# Patient Record
Sex: Female | Born: 1950 | Race: White | Hispanic: No | Marital: Single | Smoking: Current every day smoker
Health system: Southern US, Community
[De-identification: ages and names within clinical notes are randomized; demographics above are authoritative.]

## PROBLEM LIST (undated history)

## (undated) DIAGNOSIS — I10 Essential (primary) hypertension: Secondary | ICD-10-CM

## (undated) DIAGNOSIS — K529 Noninfective gastroenteritis and colitis, unspecified: Secondary | ICD-10-CM

## (undated) DIAGNOSIS — M797 Fibromyalgia: Secondary | ICD-10-CM

## (undated) DIAGNOSIS — S060XAA Concussion with loss of consciousness status unknown, initial encounter: Secondary | ICD-10-CM

## (undated) DIAGNOSIS — G4730 Sleep apnea, unspecified: Secondary | ICD-10-CM

## (undated) DIAGNOSIS — K219 Gastro-esophageal reflux disease without esophagitis: Secondary | ICD-10-CM

## (undated) DIAGNOSIS — F329 Major depressive disorder, single episode, unspecified: Secondary | ICD-10-CM

## (undated) DIAGNOSIS — R0681 Apnea, not elsewhere classified: Secondary | ICD-10-CM

## (undated) DIAGNOSIS — S060X9A Concussion with loss of consciousness of unspecified duration, initial encounter: Secondary | ICD-10-CM

## (undated) DIAGNOSIS — R011 Cardiac murmur, unspecified: Secondary | ICD-10-CM

## (undated) DIAGNOSIS — J189 Pneumonia, unspecified organism: Secondary | ICD-10-CM

## (undated) DIAGNOSIS — F419 Anxiety disorder, unspecified: Secondary | ICD-10-CM

## (undated) DIAGNOSIS — G629 Polyneuropathy, unspecified: Secondary | ICD-10-CM

## (undated) DIAGNOSIS — F32A Depression, unspecified: Secondary | ICD-10-CM

## (undated) DIAGNOSIS — R51 Headache: Secondary | ICD-10-CM

## (undated) DIAGNOSIS — R519 Headache, unspecified: Secondary | ICD-10-CM

## (undated) DIAGNOSIS — N3010 Interstitial cystitis (chronic) without hematuria: Secondary | ICD-10-CM

## (undated) DIAGNOSIS — M069 Rheumatoid arthritis, unspecified: Secondary | ICD-10-CM

## (undated) DIAGNOSIS — H269 Unspecified cataract: Secondary | ICD-10-CM

## (undated) DIAGNOSIS — R42 Dizziness and giddiness: Secondary | ICD-10-CM

## (undated) DIAGNOSIS — E7800 Pure hypercholesterolemia, unspecified: Secondary | ICD-10-CM

## (undated) HISTORY — DX: Noninfective gastroenteritis and colitis, unspecified: K52.9

## (undated) HISTORY — DX: Major depressive disorder, single episode, unspecified: F32.9

## (undated) HISTORY — DX: Interstitial cystitis (chronic) without hematuria: N30.10

## (undated) HISTORY — DX: Anxiety disorder, unspecified: F41.9

## (undated) HISTORY — PX: TONSILLECTOMY: SUR1361

## (undated) HISTORY — DX: Pure hypercholesterolemia, unspecified: E78.00

## (undated) HISTORY — PX: COLONOSCOPY: SHX174

## (undated) HISTORY — DX: Essential (primary) hypertension: I10

## (undated) HISTORY — PX: CARPAL TUNNEL RELEASE: SHX101

## (undated) HISTORY — DX: Concussion with loss of consciousness status unknown, initial encounter: S06.0XAA

## (undated) HISTORY — DX: Dizziness and giddiness: R42

## (undated) HISTORY — DX: Rheumatoid arthritis, unspecified: M06.9

## (undated) HISTORY — DX: Apnea, not elsewhere classified: R06.81

## (undated) HISTORY — DX: Unspecified cataract: H26.9

## (undated) HISTORY — DX: Depression, unspecified: F32.A

## (undated) HISTORY — PX: ROTATOR CUFF REPAIR: SHX139

## (undated) HISTORY — DX: Concussion with loss of consciousness of unspecified duration, initial encounter: S06.0X9A

## (undated) HISTORY — DX: Polyneuropathy, unspecified: G62.9

## (undated) HISTORY — DX: Fibromyalgia: M79.7

---

## 2007-04-10 ENCOUNTER — Encounter: Admission: RE | Admit: 2007-04-10 | Discharge: 2007-04-10 | Payer: Self-pay | Admitting: Internal Medicine

## 2007-09-03 ENCOUNTER — Ambulatory Visit: Payer: Self-pay | Admitting: Cardiology

## 2015-01-05 ENCOUNTER — Other Ambulatory Visit: Payer: Self-pay | Admitting: Family Medicine

## 2015-01-05 DIAGNOSIS — F0781 Postconcussional syndrome: Secondary | ICD-10-CM

## 2015-01-05 DIAGNOSIS — R410 Disorientation, unspecified: Secondary | ICD-10-CM

## 2015-01-05 DIAGNOSIS — R413 Other amnesia: Secondary | ICD-10-CM

## 2015-02-01 ENCOUNTER — Other Ambulatory Visit: Payer: Self-pay

## 2015-02-08 ENCOUNTER — Other Ambulatory Visit: Payer: Self-pay

## 2016-04-16 ENCOUNTER — Ambulatory Visit (INDEPENDENT_AMBULATORY_CARE_PROVIDER_SITE_OTHER): Payer: Medicare HMO | Admitting: Neurology

## 2016-04-16 ENCOUNTER — Encounter: Payer: Self-pay | Admitting: Neurology

## 2016-04-16 VITALS — Ht 68.0 in | Wt 142.4 lb

## 2016-04-16 DIAGNOSIS — R42 Dizziness and giddiness: Secondary | ICD-10-CM

## 2016-04-16 DIAGNOSIS — R413 Other amnesia: Secondary | ICD-10-CM | POA: Diagnosis not present

## 2016-04-16 DIAGNOSIS — E538 Deficiency of other specified B group vitamins: Secondary | ICD-10-CM | POA: Diagnosis not present

## 2016-04-16 DIAGNOSIS — R51 Headache: Secondary | ICD-10-CM

## 2016-04-16 DIAGNOSIS — R410 Disorientation, unspecified: Secondary | ICD-10-CM | POA: Diagnosis not present

## 2016-04-16 DIAGNOSIS — R519 Headache, unspecified: Secondary | ICD-10-CM

## 2016-04-16 NOTE — Patient Instructions (Addendum)
Remember to drink plenty of fluid, eat healthy meals and do not skip any meals. Try to eat protein with a every meal and eat a healthy snack such as fruit or nuts in between meals. Try to keep a regular sleep-wake schedule and try to exercise daily, particularly in the form of walking, 20-30 minutes a day, if you can.   As far as your medications are concerned, I would like to suggest: Try meclizine for the vertigo  As far as diagnostic testing: MRI of the brain  I would like to see you back in 3 months, sooner if we need to. Please call us with any interim questions, concerns, problems, updates or refill requests.   Our phone number is [phone]. We also have an after hours call service for urgent matters and there is a physician on-call for urgent questions. For any emergencies you know to call 911 or go to the nearest emergency room

## 2016-04-16 NOTE — Progress Notes (Signed)
[facility]    Provider:  Dr Lucia Gaskins Referring Provider: Stoney Bang, FNP Primary Care Physician:  Stoney Bang, FNP  CC:  Vertigo, memory loss and confusion, headache  HPI:  Joy Thomas is a 66 y.o. female here as a referral from Dr. Craig Guess for vertigo. Medical history hyperlipidemia, depression, hypertension, chronic obstructive lung disease, GERD, shoulder surgery and carpal tunnel surgery on the right, lymphocytic colitis. She is a current every day smoker of half pack per day. No alcohol. Her father has a history of Alzheimer's and parkinson's disease and she is worried it may be hereditary. She had a concussion several years ago and had a concussion and headache and she still is on lamotrigine since 2016 for headache which never improved and recently worsened. A month ago she was trying to clean up and she hit the back of her head and she had all the symptoms of when she had a concussion. She first noticed memory issues a year ago but difficult to tell. She has pain in her neck and in the back of her head where she hit on the corner of the cabinet. For several months she has felt more confused, she forgot to put a straw in her drink the other day and she has been putting a straw in there for years. She has a lot of anxiety and in the 80s she became house bound with panic attacks. She has a lot of anxiety. Panic attacks are coming back. She lives independently. All the bills are paid. No accidents in the home. She takes her medications. She does not repeat the same stories. No hallucinations or delusions. She does not get lost driving. She will go five miles out of the way to miss lights. No problems with day, month, year. No other focal neurologic deficits, associated symptoms, inciting events or modifiable factors.  MRI cervical spine 2012: reviewed report  FINDINGS: .Craniocervical junction: Normal. .Cervical spinal cord: Normal signal and  contour. .Vertebrae:No marrow signal abnormalities to suggest neoplasm. Reversal of usual mid cervical lordosis. .Paraspinal soft tissues: No edema or masses. .Discs/Levels: .C2-3: Unremarkable. .C3-4: Unremarkable. .C4-5: Small posterior disc osteophyte complex with mild central canal stenosis. .C5-6: Small broad-based posterior disc osteophyte with mild central canal stenosis .C6-7: Small broad-based posterior disc osteophyte complex without significant central canal stenosis. Small amount of left-sided uncovertebral spurring results in mild foraminal stenosis. .C7-T1: Unremarkable.  Review of Systems: Patient complains of symptoms per HPI as well as the following symptoms: no CP, no SOB. Pertinent negatives per HPI. All others negative.   Social History   Social History  . Marital status: Single    Spouse name: N/A  . Number of children: 0  . Years of education: Master's   Occupational History  . Retired    Social History Main Topics  . Smoking status: Current Every Day Smoker    Packs/day: 0.75    Types: Cigarettes  . Smokeless tobacco: Never Used  . Alcohol use No  . Drug use: No  . Sexual activity: Not on file   Other Topics Concern  . Not on file   Social History Narrative   Lives at home w/ her 2 cats   Right-handed   Caffeine: 12-24 oz per day    Family History  Problem Relation Age of Onset  . Other Mother     Blunt force trauma  . Stroke Mother   . Heart attack Mother   . Heart failure Father   . Parkinson's disease  Father   . Alzheimer's disease Father     Past Medical History:  Diagnosis Date  . Anxiety   . Apnea    C pap  . Cataract   . Chronic interstitial cystitis   . Colitis   . Concussion   . Depression   . Fibromyalgia   . High cholesterol   . Hypertension   . Neuropathy (HCC)   . Rheumatoid arthritis (HCC)   . Vertigo     Past Surgical History:  Procedure Laterality Date  . CARPAL  TUNNEL RELEASE    . ROTATOR CUFF REPAIR    . TONSILLECTOMY      Current Outpatient Prescriptions  Medication Sig Dispense Refill  . aspirin (ECOTRIN LOW STRENGTH) 81 MG EC tablet Take 81 mg by mouth daily. Swallow whole.    . budesonide (ENTOCORT EC) 3 MG 24 hr capsule Take by mouth.    . busPIRone (BUSPAR) 5 MG tablet Take 15 mg by mouth 2 (two) times daily.    . Calcium Carb-Cholecalciferol (CALCIUM + D3) 600-200 MG-UNIT TABS Take 1 tablet by mouth 2 (two) times daily.    . Cholecalciferol (VITAMIN D3) 2000 units capsule Take 1 capsule by mouth daily.    . clorazepate (TRANXENE) 7.5 MG tablet Take by mouth.    . co-enzyme Q-10 30 MG capsule Take by mouth.    . Cyanocobalamin (VITAMIN B12 PO) Take by mouth daily.    [name] docusate sodium (COLACE) 100 MG capsule Take by mouth.    . esomeprazole (NEXIUM) 40 MG capsule Take by mouth.    . Flaxseed, Linseed, (FLAX SEED OIL PO) Take by mouth daily.    [name] gabapentin (NEURONTIN) 300 MG capsule Take 300 mg by mouth 3 (three) times daily.    . hyoscyamine (LEVSIN SL) 0.125 MG SL tablet Place under the tongue as needed.    . lamoTRIgine (LAMICTAL) 100 MG tablet Take by mouth.    . loratadine (CLARITIN) 10 MG tablet Take by mouth.    . Multiple Vitamins-Minerals (MULTIVITAMIN ADULT PO) Take 1 tablet by mouth daily.    [name] olmesartan-hydrochlorothiazide (BENICAR HCT) 20-12.5 MG tablet Take 1 tablet by mouth daily.    . Omega-3 Fatty Acids (FISH OIL) 1200 MG CAPS Take by mouth daily.    [name] oxybutynin (DITROPAN) 5 MG tablet Take by mouth.    . potassium chloride SA (K-DUR,KLOR-CON) 20 MEQ tablet 1 by mouth daily    . Probiotic Product (PROBIOTIC PO) Take by mouth daily.    . simvastatin (ZOCOR) 40 MG tablet 1 tab po qd    . vitamin E 400 UNIT capsule Take 800 Units by mouth daily.     No current facility-administered medications for this visit.     Allergies as of 04/16/2016 - Review Complete 04/16/2016  Allergen Reaction Noted  . Amoxicillin-pot  clavulanate Nausea And Vomiting 01/16/2011  . Elmiron  [pentosan polysulfate sodium]  11/06/2011  . Erythromycin  01/16/2011  . Hydrocodone-acetaminophen Rash and Swelling 01/16/2011  . Naproxen  04/07/2015  . Clindamycin  01/17/2011  . Pseudoephedrine Anxiety 09/26/2014    Vitals: Ht 5\' 8"  (1.727 m)   Wt 142 lb 6.4 oz (64.6 kg)   BMI 21.65 kg/m  Last Weight:  Wt Readings from Last 1 Encounters:  04/16/16 142 lb 6.4 oz (64.6 kg)   Last Height:   Ht Readings from Last 1 Encounters:  04/16/16 5\' 8"  (1.727 m)   Physical exam: Exam: Gen: NAD, conversant, well nourised, Thin, well groomed,  Anxious and tangential                   CV: RRR, no MRG. No Carotid Bruits. No peripheral edema, warm, nontender Eyes: Conjunctivae clear without exudates or hemorrhage  Neuro: Detailed Neurologic Exam  Speech:    Speech is normal; fluent and spontaneous with normal comprehension.  Cognition:  MMSE - Mini Mental State Exam 04/16/2016  Orientation to time 5  Orientation to Place 5  Registration 3  Attention/ Calculation 5  Recall 3  Language- name 2 objects 2  Language- repeat 1  Language- follow 3 step command 3  Language- read & follow direction 1  Write a sentence 0  Copy design 1  Total score 29      The patient is oriented to person, place, and time;     recent and remote memory intact;     language fluent;     normal attention, concentration,     fund of knowledge Cranial Nerves:    The pupils are equal, round, and reactive to light. The fundi are normal and spontaneous venous pulsations are present. Visual fields are full to finger confrontation. Extraocular movements are intact. Trigeminal sensation is intact and the muscles of mastication are normal. The face is symmetric. The palate elevates in the midline. Hearing intact. Voice is normal. Shoulder shrug is normal. The tongue has normal motion without fasciculations.   Coordination:    Normal finger to nose and heel to  shin. Normal rapid alternating movements.   Gait:    Heel-toe and tandem gait are normal.   Motor Observation:    No asymmetry, no atrophy, and no involuntary movements noted. Tone:    Normal muscle tone.    Posture:    Posture is normal. normal erect    Strength:    Strength is V/V in the upper and lower limbs.      Sensation: intact to LT     Reflex Exam:  DTR's:    Deep tendon reflexes in the upper and lower extremities are normal bilaterally.   Toes:    The toes are downgoing bilaterally.   Clonus:    Clonus is absent.       Assessment/Plan:  66 year old here for evaluation of headaches, vertigo, confusion, neck pain, memory loss and multiple other complaints. She has significant anxiety and panic attacks and was house-bound in the 80s due to this. Today she is pleasant but very talkative, tangential and difficult to redirect. I don't suspect dementia, MMSE was 29/30, she reports panic attacks are coming back and many of her issues may be psychiatric complicated by polypharmacy;  She lives independently. All the bills are paid. No accidents in the home. She takes her medications. She does not repeat the same stories. No hallucinations or delusions. She does not get lost driving. She can travel five miles out of the way to miss lights and successfully gets to her destination per patient. No problems with day, month, year. No other focal neurologic deficits, associated symptoms, inciting events or modifiable factors.  Open MRI of the brain to evaluate for intracranial etiologies including stroke or lesions Labs today F/u 3 months  Cc: [place], [place], [place]  Naomie Dean, MD  [facility] [address] [place], [place] [address]  Phone [phone] Fax [phone]

## 2016-04-17 ENCOUNTER — Telehealth: Payer: Self-pay | Admitting: *Deleted

## 2016-04-17 NOTE — Telephone Encounter (Signed)
Called and LVM for pt about lab results per AA,MD note. Gave [facility] phone number if she has further questions.

## 2016-04-17 NOTE — Telephone Encounter (Signed)
-----   Message from [name], MD sent at 04/17/2016  2:22 PM EST ----- B12 is normal, looks great. thanks

## 2016-04-18 ENCOUNTER — Other Ambulatory Visit: Payer: Self-pay

## 2016-04-18 LAB — SPECIMEN STATUS REPORT

## 2016-04-19 LAB — CREATININE, SERUM
CREATININE: 0.78 mg/dL (ref 0.57–1.00)
GFR, EST AFRICAN AMERICAN: 91 mL/min/{1.73_m2} (ref >59)
GFR, EST NON AFRICAN AMERICAN: 79 mL/min/{1.73_m2} (ref >59)

## 2016-04-19 LAB — B12 AND FOLATE PANEL
Folate: >20 ng/mL (ref >3.0)
VITAMIN B 12: 895 pg/mL (ref 232–1245)

## 2016-04-19 LAB — METHYLMALONIC ACID, SERUM: Methylmalonic Acid: 196 nmol/L (ref 0–378)

## 2016-04-19 LAB — BUN: BUN: 12 mg/dL (ref 8–27)

## 2016-04-19 LAB — SPECIMEN STATUS REPORT

## 2016-04-23 ENCOUNTER — Other Ambulatory Visit: Payer: Self-pay | Admitting: Neurology

## 2016-04-23 ENCOUNTER — Telehealth: Payer: Self-pay | Admitting: Neurology

## 2016-04-23 MED ORDER — GABAPENTIN 300 MG PO CAPS: 300.0000 mg | ORAL_CAPSULE | Freq: Three times a day (TID) | ORAL | 11 refills | Status: DC

## 2016-04-23 NOTE — Telephone Encounter (Signed)
Patient having headache, discussed everything from appointment again, patient difficult to redirect and multiple concerns, will increase her gabapentin to 300mg  three times a day.   Patient complained she was told she has to cancel her appointment for imaging because she is not pre-approved? can you look into this please? Patient is very upset. I patiently listened for 45 minutes. Thank you.

## 2016-04-24 NOTE — Telephone Encounter (Signed)
[facility] is not in network with the patients insurance.. I spoke with the patient and informed her of this and she was very upset.. I am currently trying to find a place that is in network with her insurance.. And it is hard because she said she needs open MRI. If it is not an open MRI she stated she will not be able to do it.. Because she informed me even if I give her the dimensions of the machine she needs to actually see a picture of it.. I did find out that [facility] is in network with her insurance but they do not have a open MRI.[name] So therefore she doesn't want to go.[name]

## 2016-04-24 NOTE — Telephone Encounter (Signed)
She did not have the CT of the head. Do you still need me to call for peer-to-peer or can you let them know?

## 2016-04-24 NOTE — Telephone Encounter (Signed)
I called and spoke with the patient. I informed her that her insurance was only offering to cover her at certain facilities. I informed her that our office had called all of these locations and none of them would offer a fully open unit. I gave her the option of going to the hospital because they have a larger unit and they are covered through her insurance, I told her we could send the doctor a request for something to ease her nerves. She did not want to this, I suggested that she call her insurance plan and discuss it with them and call us back when she has made a decision. She stated that she would do so. She also suggested the possibility of being sedated for the MRI. I told her I would send this over to Dr. Lucia Gaskins and see what she suggest. We told the patient we were going to make [facility] aware so they could cancel the apt.The patient will call back with an update after she speaks with her insurance.

## 2016-04-24 NOTE — Telephone Encounter (Signed)
Alvino Chapel with [facility] just started the case for the CT.Joy KitchenShe has a HMO policy and since she is in [place] she may only be able to have the exam in [place]. [employer] also mentioned a previous CT head that Dr. Lorelei Pont order and they are wanting to know if that CT head was performed.. If so do we have the results & can we submitted them to [employer].. If she did not have the CT head then we need to let [employer] know that she did not have the CT head. The phone number for the peer to peer is [phone] and the case number is [id number].

## 2016-04-24 NOTE — Telephone Encounter (Signed)
I called the patient and left a voicemail for her to call me back.

## 2016-04-25 ENCOUNTER — Telehealth: Payer: Self-pay | Admitting: Neurology

## 2016-04-25 NOTE — Telephone Encounter (Signed)
I found a location that was in network with her insurance for her to have her MRI... I tried to do the authorization for the MRI with [employer] but they did not approve it and went into more clinical review. The phone number for the peer to peer is [phone] and the case number is [id number]. Please do by 2 business days. Thank you for your help!

## 2016-04-25 NOTE — Telephone Encounter (Signed)
Open by error

## 2016-04-26 NOTE — Telephone Encounter (Signed)
I called and I cannot provide a peer-to-peer review. They will let us know when they further review the case. I recommend she go to the ED. If she is in that much pain she should go to the ED and get a stat MRI. This may also be the way to get her MRI completed since insurance is giving [place] such a hard time. ED for headache is my recommendation and insist on MRI.

## 2016-04-26 NOTE — Telephone Encounter (Signed)
Patient just called in very upset crying stating she is in so much pain and having so much stress and anxiety right now dealing with her insurance.. And she is very upset that this has been going on for 2 weeks.. I informed her that the insurance is still pending with the authorization and as soon as that has been cleared I will contact her so we can get the appointment made in [place].

## 2016-04-26 NOTE — Telephone Encounter (Signed)
Per Dr Lucia Gaskins, spoke with patient and advised her of Dr Trevor Mace specific reply and advice. Patient was concerned about going to the ED and her insurance covering. This RN advised her to call her insurance for answers to her questions. Recommended several times she go to ED if her headache is severe. Patient stated she would wait for this office to get reply from insurance and come to [facility] ED if she could get a ride. She verbalized understanding, appreciation of call.

## 2016-04-30 ENCOUNTER — Other Ambulatory Visit: Payer: Self-pay

## 2016-04-30 NOTE — Telephone Encounter (Signed)
She is scheduled at [facility] hospital for 05/28/16. She is aware that someone from [facility] will contact her and give her all the information about being sedated. Patient is aware and understands.

## 2016-04-30 NOTE — Telephone Encounter (Signed)
I left a voicemail for patient to call me back about scheduling MRI.  °

## 2016-05-07 NOTE — Telephone Encounter (Signed)
Patient called office in refence to MRI scheduled for 05/28/16.  Patient would like to see if she is able to have MRI sooner due to headaches being very bad.  Please call

## 2016-05-08 NOTE — Telephone Encounter (Signed)
Late entry.. I spoke with the patient last week and informed her that she could call [facility] at [phone] and ask them if they have a wait list. Because I do not have control over [facility] wait list or how it works due to they only do sedated ones on Tuesday & Thursday. Her insurance really only covers [facility].

## 2016-05-10 NOTE — Telephone Encounter (Signed)
Called [facility] scheduling and they do not have a sooner appt available. Pt is already scheduled for earliest appt they have for MRI w/ sedation as these are only performed on Tues and Thurs.

## 2016-05-12 ENCOUNTER — Telehealth: Payer: Self-pay | Admitting: Neurology

## 2016-05-12 NOTE — Telephone Encounter (Signed)
I spoke to Joy Thomas.   She reports her headache is worse.   She states her MRI is not scheduled for another 2 weeks  I let her know I would forward her message to Dr. Lucia Gaskins and she might be able to be worked in sooner and the MRI could probably be moved up.  I also let her know that if she feels headache is severe that she could go to the ER.  However, she is not sure she wants to do that.     She states she is not supposed to take NSAIDs due to colitis so advised to take Tylenol q 6-8 hours.

## 2016-05-12 NOTE — Telephone Encounter (Signed)
Joy Thomas, call and offer patient an appt sooner with Eber Jones she keeps calling about her headache. We can't move the MRI up because she is getting anesthesia. Maybe [facility] Jones has something in the next week or two before she goes for MRI. But patient has to understand she will be seen just for headaches, these appointments are 30 minutes long and we cannot address multiple issues like last appointment please ask her to be prepared to only discuss headaches. I have a follow up with her as well. Thanks

## 2016-05-13 NOTE — Telephone Encounter (Signed)
Called [facility] scheduling last week and they do not have a sooner appt available. Pt is already scheduled for earliest appt they have for MRI w/ sedation as these are only performed on Tues and Thurs. Pt notified of this via TC. She may call back if she would like to schedule a sooner appt for OV to discuss HAs. (Currently, Dr. Lucia Gaskins and Aundra Millet have availability on Wed 3/14.)

## 2016-05-14 NOTE — Telephone Encounter (Signed)
Pt called said she is in severe pain in the neck and head into the shoulder. Says she is very weak and confused. She said she cannot wait until the end of the month. Please call

## 2016-05-15 NOTE — Telephone Encounter (Signed)
Patient does not need furtehr labs, the BUN and Creat in Feb should be fine. Also let her know we did not discuss details about her headaches because the last appointment was geared towards memory. After MRI brain she has to come back for follow up appt to discuss headaches thanks.

## 2016-05-15 NOTE — Telephone Encounter (Signed)
Returned pt's call. Said that she's been sleeping a lot today. Still not feeling well. Told her again that there is no availability for a sooner MRI w/ sedation. She responded that they should probably open up more days since there seems to be a demand for that procedure. Offered to schedule a sooner appt w/ NP to discuss headaches and neck pain but pt again did not schedule. Requested that she have pre-MRI labs performed closer to home. Says that her PCP, [facility] uses [facility] and she would like to have blood work done there. The F # is [phone]. Pt did have BUN and creatinine drawn at her 04/16/16 appt w/ both w/i normal limits. Her MRI is scheduled 05/28/16.

## 2016-05-16 NOTE — Telephone Encounter (Signed)
Patient called office and has been notified bun/creat levels will not need to be rechecked prior to MRI patient voiced understanding.  Patient also advised she will receive a phone call with MRI results usually with in a week of MRI being done.

## 2016-05-16 NOTE — Telephone Encounter (Signed)
Called pt and let her know that she had labs completed for BUN/creat when here last month and shouldn't require additional labs prior to MRI. She may call back if she would like to schedule sooner appt to discuss HAs. Otherwise, Dr. Lucia Gaskins will see her for follow-up after MRI to review results.

## 2016-05-22 ENCOUNTER — Other Ambulatory Visit: Payer: Self-pay | Admitting: Neurology

## 2016-05-22 ENCOUNTER — Telehealth: Payer: Self-pay | Admitting: Neurology

## 2016-05-22 DIAGNOSIS — M542 Cervicalgia: Principal | ICD-10-CM

## 2016-05-22 DIAGNOSIS — M5412 Radiculopathy, cervical region: Secondary | ICD-10-CM

## 2016-05-22 DIAGNOSIS — G8929 Other chronic pain: Secondary | ICD-10-CM

## 2016-05-22 NOTE — Telephone Encounter (Signed)
I can try but insurance may decline it because I have no clinical notes discussing her pain here. I can TRY to order a cervical MRi but if she has hip pain she needs to see an orthopaedic doctor for that thanks.

## 2016-05-22 NOTE — Telephone Encounter (Signed)
Let pt know that cervical MRI was placed but may not be approved. Recommended that she consult w/ ortho for lower back/hip pain as she has not been seen for this at [name]. May call back w/ questions/concerns.

## 2016-05-22 NOTE — Telephone Encounter (Signed)
New patient seen 04/16/16 for memory/confusion, headache and vertigo. She has brain MRI with sedation scheduled at [facility] next week (05/28/16).

## 2016-05-22 NOTE — Telephone Encounter (Signed)
Pt called stating that due to pain in her neck,back and hip she went to ER last night and was told by ER Dr that she has arthritis and a pinched nerve and told her to ask if in addition to her having her head scanned if also a spine scan can be done as well

## 2016-05-23 ENCOUNTER — Other Ambulatory Visit (HOSPITAL_COMMUNITY): Payer: Self-pay

## 2016-05-23 NOTE — Telephone Encounter (Signed)
I spoke with the patient who requested to have her cervical MRI at the same time as her Brain MRI. I told her that we would work on getting the authorization but we could not promise that there would be availability on that day at [facility]. This apt is just a few days away. I told her that we would call her back as soon as we know something about the scheduling and the authorization.

## 2016-05-23 NOTE — Telephone Encounter (Signed)
The patient insurance [employer] require clinical notes to be faxed to be able to do the authorization.Joy KitchenMarland KitchenSince there is no discussing of neck, back and hip pain in the notes the insurance will not approve the MR Cervical spine.Joy Thomas She would have to be seen again to go over the new symptom's before the insurance would approve the MRI.

## 2016-05-24 NOTE — Telephone Encounter (Signed)
[employer] approved the Cervical spine [id number] (exp. 05/23/16 to 08/21/16).. She is scheduled to have both the cervical & brain at the same time on 05/28/16 at [facility].. I left a voicemail to the patient to inform her that everything has been approved and that she will have both of them on Tuesday.. 05/28/16.[name]

## 2016-05-27 ENCOUNTER — Encounter (HOSPITAL_COMMUNITY): Payer: Self-pay | Admitting: *Deleted

## 2016-05-27 NOTE — Progress Notes (Signed)
Pt denies SOB, chest pain, and being under the care of a cardiologist. Pt stated that a stress test was performed > 10 years ago. Pt denies having a cardiac cath and echo. Pt denies having an EKG within the last year. Pt denies recent labs. Pt made aware top stop taking vitamins and herbal medications such as Flaxseed. Pt verbalized understanding of all pre-op instructions.

## 2016-05-28 ENCOUNTER — Ambulatory Visit (HOSPITAL_COMMUNITY): Admission: RE | Admit: 2016-05-28 | Payer: Medicare HMO | Source: Ambulatory Visit

## 2016-05-28 ENCOUNTER — Ambulatory Visit (HOSPITAL_COMMUNITY)
Admission: RE | Admit: 2016-05-28 | Discharge: 2016-05-28 | Disposition: A | Discharge status: Home / Self Care | Payer: Medicare HMO | Source: Ambulatory Visit | Attending: Neurology | Admitting: Neurology

## 2016-05-28 ENCOUNTER — Ambulatory Visit (HOSPITAL_COMMUNITY): Payer: Medicare HMO | Admitting: Anesthesiology

## 2016-05-28 ENCOUNTER — Encounter (HOSPITAL_COMMUNITY): Payer: Self-pay

## 2016-05-28 ENCOUNTER — Encounter (HOSPITAL_COMMUNITY)
Admission: RE | Disposition: A | Discharge status: Home / Self Care | Payer: Self-pay | Source: Ambulatory Visit | Attending: Neurology

## 2016-05-28 DIAGNOSIS — R413 Other amnesia: Secondary | ICD-10-CM

## 2016-05-28 DIAGNOSIS — M542 Cervicalgia: Secondary | ICD-10-CM

## 2016-05-28 DIAGNOSIS — G8929 Other chronic pain: Secondary | ICD-10-CM

## 2016-05-28 DIAGNOSIS — M5412 Radiculopathy, cervical region: Secondary | ICD-10-CM

## 2016-05-28 DIAGNOSIS — R51 Headache: Secondary | ICD-10-CM | POA: Insufficient documentation

## 2016-05-28 DIAGNOSIS — R410 Disorientation, unspecified: Secondary | ICD-10-CM | POA: Insufficient documentation

## 2016-05-28 DIAGNOSIS — R519 Headache, unspecified: Secondary | ICD-10-CM

## 2016-05-28 DIAGNOSIS — M4802 Spinal stenosis, cervical region: Secondary | ICD-10-CM | POA: Diagnosis not present

## 2016-05-28 DIAGNOSIS — R42 Dizziness and giddiness: Secondary | ICD-10-CM

## 2016-05-28 HISTORY — DX: Cardiac murmur, unspecified: R01.1

## 2016-05-28 HISTORY — DX: Gastro-esophageal reflux disease without esophagitis: K21.9

## 2016-05-28 HISTORY — DX: Pneumonia, unspecified organism: J18.9

## 2016-05-28 HISTORY — DX: Headache: R51

## 2016-05-28 HISTORY — DX: Sleep apnea, unspecified: G47.30

## 2016-05-28 HISTORY — PX: RADIOLOGY WITH ANESTHESIA: SHX6223

## 2016-05-28 HISTORY — DX: Headache, unspecified: R51.9

## 2016-05-28 LAB — CBC
HEMATOCRIT: 37 % (ref 36.0–46.0)
Hemoglobin: 11.5 g/dL — ABNORMAL LOW (ref 12.0–15.0)
MCH: 27.7 pg (ref 26.0–34.0)
MCHC: 31.1 g/dL (ref 30.0–36.0)
MCV: 89.2 fL (ref 78.0–100.0)
Platelets: 231 10*3/uL (ref 150–400)
RBC: 4.15 MIL/uL (ref 3.87–5.11)
RDW: 13.3 % (ref 11.5–15.5)
WBC: 7.7 10*3/uL (ref 4.0–10.5)

## 2016-05-28 SURGERY — RADIOLOGY WITH ANESTHESIA
Anesthesia: General

## 2016-05-28 MED ORDER — GADOBENATE DIMEGLUMINE 529 MG/ML IV SOLN
14.0000 mL | Freq: Once | INTRAVENOUS | Status: AC
  Administered 2016-05-28: 14 mL via INTRAVENOUS

## 2016-05-28 NOTE — Transfer of Care (Signed)
Immediate Anesthesia Transfer of Care Note  Patient: Joy Thomas  Procedure(s) Performed: Procedure(s): MRI OF THE BRAIN WITH AND WITHOUT and MRI OF CERVICAL SPINE (N/A)  Patient Location: PACU  Anesthesia Type:General  Level of Consciousness: awake, alert  and oriented  Airway & Oxygen Therapy: Patient Spontanous Breathing  Post-op Assessment: Report given to RN, Post -op Vital signs reviewed and stable and Patient moving all extremities X 4  Post vital signs: Reviewed and stable  Last Vitals:  Vitals:   05/28/16 1005 05/28/16 1020  BP: 136/68 117/69  Pulse: (!) 52 (!) 50  Resp: 12 11  Temp: 36.2 C     Last Pain:  Vitals:   05/28/16 1005  TempSrc:   PainSc: 0-No pain      Patients Stated Pain Goal: 0 (05/28/16 3419)  Complications: No apparent anesthesia complications

## 2016-05-29 ENCOUNTER — Encounter (HOSPITAL_COMMUNITY): Payer: Self-pay | Admitting: Radiology

## 2016-05-29 NOTE — H&P (Signed)
error 

## 2016-05-30 ENCOUNTER — Telehealth: Payer: Self-pay

## 2016-05-30 NOTE — Telephone Encounter (Signed)
-----   Message from Antonia B Ahern, MD sent at 05/29/2016  9:52 AM EDT ----- Patient's brain is normal for age. She has sinus disease in her right maxillary sinus which can be contributing to headaches, she should be seen by ENT I recommend she call her primary care doctor for a referral near her. (Please send mri results to her pcp). In her neck she has arthritic changes and likely pinched nerves more so on the left. I think she needs to be seen by an orthopaedic doctor for evaluation of treatment. Again, pcp should place referral for this. If she wants to be treated for headaches she needs to come back tot he office and have a follow up just for headaches. She can see Caroline earlier or she can wait for her appointment with me in May. Thank you 

## 2016-05-30 NOTE — Telephone Encounter (Signed)
Called pt w/ MRI results and recommendations for referrals. Said that she did not want to go to specialists near her home because "they don't know anything." Gave her names and contact info of ENT here in [place] and [facility] per her request. Stated that Monia Pouch, her insurance company told her if a specialist ordered a referral, she could get a sooner appt. Let her know that it can take several weeks for a new patient appt to any specialist, depending on their availability, no matter who initially sends the referral. Recommended that she contact PCP for those referrals. Kept f/u scheduled in May but agreed to call back if she would like a sooner appt at [facility] w/ nurse practitioner for HAs.

## 2016-05-30 NOTE — Telephone Encounter (Signed)
Pt called back requesting OV notes, MRI results and referral recommendations be faxed to PCP - [facility] so they can place ENT and ortho referral. Faxed to F # [phone] as requested, T # [phone].

## 2016-05-30 NOTE — Telephone Encounter (Signed)
Opened in error

## 2016-05-30 NOTE — Telephone Encounter (Signed)
-----   Message from [name], MD sent at 05/29/2016  9:52 AM EDT ----- Patient's brain is normal for age. She has sinus disease in her right maxillary sinus which can be contributing to headaches, she should be seen by ENT I recommend she call her primary care doctor for a referral near her. (Please send mri results to her pcp). In her neck she has arthritic changes and likely pinched nerves more so on the left. I think she needs to be seen by an orthopaedic doctor for evaluation of treatment. Again, pcp should place referral for this. If she wants to be treated for headaches she needs to come back tot he office and have a follow up just for headaches. She can see Rayfield Citizen earlier or she can wait for her appointment with me in May. Thank you

## 2016-05-31 ENCOUNTER — Encounter (HOSPITAL_COMMUNITY): Payer: Self-pay | Admitting: Radiology

## 2016-05-31 NOTE — Anesthesia Postprocedure Evaluation (Addendum)
Anesthesia Post Note  Patient: Joy Thomas  Procedure(s) Performed: Procedure(s) (LRB): MRI OF THE BRAIN WITH AND WITHOUT and MRI OF CERVICAL SPINE (N/A)  Patient location during evaluation: PACU Anesthesia Type: General Level of consciousness: awake and alert Pain management: pain level controlled Vital Signs Assessment: post-procedure vital signs reviewed and stable Respiratory status: spontaneous breathing, nonlabored ventilation, respiratory function stable and patient connected to nasal cannula oxygen Cardiovascular status: blood pressure returned to baseline and stable Postop Assessment: no signs of nausea or vomiting Anesthetic complications: no       Last Vitals:  Vitals:   05/28/16 1035 05/28/16 1045  BP: 124/72 128/67  Pulse: (!) 55 (!) 49  Resp: 14 14  Temp:  36.6 C    Last Pain:  Vitals:   05/28/16 1005  TempSrc:   PainSc: 0-No pain                 Caprisha Bridgett

## 2016-05-31 NOTE — Anesthesia Preprocedure Evaluation (Addendum)
Anesthesia Evaluation  Patient identified by MRN, date of birth, ID band Patient awake    Reviewed: Allergy & Precautions, NPO status , Patient's Chart, lab work & pertinent test results  Airway Mallampati: II  TM Distance: >3 FB Neck ROM: Full    Dental  (+) Dental Advisory Given   Pulmonary sleep apnea , Current Smoker,    breath sounds clear to auscultation       Cardiovascular hypertension,  Rhythm:Regular     Neuro/Psych  Headaches, PSYCHIATRIC DISORDERS Anxiety Depression Cervical pain and headaches  Neuromuscular disease    GI/Hepatic GERD  Controlled and Medicated,  Endo/Other    Renal/GU      Musculoskeletal  (+) Arthritis , Fibromyalgia -  Abdominal   Peds  Hematology   Anesthesia Other Findings   Reproductive/Obstetrics                            Anesthesia Physical Anesthesia Plan  ASA: II  Anesthesia Plan: General   Post-op Pain Management:    Induction: Intravenous  Airway Management Planned: LMA  Additional Equipment: None  Intra-op Plan:   Post-operative Plan: Extubation in OR  Informed Consent: I have reviewed the patients History and Physical, chart, labs and discussed the procedure including the risks, benefits and alternatives for the proposed anesthesia with the patient or authorized representative who has indicated his/her understanding and acceptance.   Dental advisory given  Plan Discussed with: CRNA and Surgeon  Anesthesia Plan Comments:         Anesthesia Quick Evaluation

## 2016-07-18 ENCOUNTER — Ambulatory Visit: Payer: Medicare HMO | Admitting: Neurology

## 2016-08-05 NOTE — Addendum Note (Signed)
Addendum  created 08/05/16 1251 by Jaelen Gellerman, MD   Sign clinical note    

## 2018-10-01 ENCOUNTER — Other Ambulatory Visit (HOSPITAL_COMMUNITY): Payer: Self-pay | Admitting: Family Medicine

## 2018-10-01 ENCOUNTER — Other Ambulatory Visit: Payer: Self-pay | Admitting: Family Medicine

## 2018-10-01 DIAGNOSIS — R2681 Unsteadiness on feet: Secondary | ICD-10-CM

## 2018-10-01 DIAGNOSIS — R42 Dizziness and giddiness: Secondary | ICD-10-CM

## 2018-10-14 ENCOUNTER — Encounter (HOSPITAL_COMMUNITY): Payer: Self-pay

## 2018-10-14 ENCOUNTER — Ambulatory Visit (HOSPITAL_COMMUNITY): Payer: Medicare HMO

## 2018-10-15 ENCOUNTER — Telehealth (HOSPITAL_COMMUNITY): Payer: Self-pay

## 2018-10-24 ENCOUNTER — Ambulatory Visit (HOSPITAL_COMMUNITY): Payer: Medicare HMO

## 2018-10-31 ENCOUNTER — Ambulatory Visit (HOSPITAL_COMMUNITY)
Admission: RE | Admit: 2018-10-31 | Discharge: 2018-10-31 | Disposition: A | Discharge status: Home / Self Care | Payer: Medicare HMO | Source: Ambulatory Visit | Attending: Family Medicine | Admitting: Family Medicine

## 2018-10-31 DIAGNOSIS — R42 Dizziness and giddiness: Secondary | ICD-10-CM

## 2018-10-31 DIAGNOSIS — R2681 Unsteadiness on feet: Secondary | ICD-10-CM

## 2018-10-31 NOTE — Progress Notes (Signed)
Pt arrived to department for scan today. Pt was under the impression that we would be giving her medication today. She was made aware that we do not administer nor provide medications. We tried explaining that medication is normally picked up at pharmacy. Pt refused exam and left immediately. She also refused escort back to car.

## 2018-11-16 ENCOUNTER — Encounter (HOSPITAL_COMMUNITY): Payer: Self-pay | Admitting: Certified Registered Nurse Anesthetist

## 2018-11-16 NOTE — Progress Notes (Signed)
While working up pt's chart for a pre-op call it was noted that when the pharmacy tech called pt she told them she was rescheduling. I have left a message for pt to call me and also called and left a message for Dr. Mendel Corning nurse to call me back to see if they are aware of pt rescheduling.

## 2018-11-16 NOTE — Progress Notes (Signed)
Spoke with pt and she states she needs to reschedule this MRI. She states she sees her PCP this week and will reschedule then. I have called and spoke with Sharmilla at MRI scheduling and she states she will send this information to [facility] who set up the appt. I then called the OR desk and spoke with Lattie Haw to notify them to remove pt from OR schedule.

## 2018-11-17 ENCOUNTER — Encounter (HOSPITAL_COMMUNITY): Payer: Self-pay

## 2018-11-17 ENCOUNTER — Ambulatory Visit (HOSPITAL_COMMUNITY): Admission: RE | Admit: 2018-11-17 | Payer: Medicare HMO | Source: Home / Self Care

## 2018-11-17 ENCOUNTER — Ambulatory Visit (HOSPITAL_COMMUNITY): Payer: Medicare HMO

## 2018-11-17 SURGERY — MRI WITH ANESTHESIA
Anesthesia: General

## 2018-12-29 ENCOUNTER — Other Ambulatory Visit (HOSPITAL_COMMUNITY)
Admission: RE | Admit: 2018-12-29 | Discharge: 2018-12-29 | Disposition: A | Discharge status: Home / Self Care | Payer: Medicare HMO | Source: Ambulatory Visit | Attending: Family Medicine | Admitting: Family Medicine

## 2018-12-29 ENCOUNTER — Other Ambulatory Visit: Payer: Self-pay

## 2018-12-29 DIAGNOSIS — Z20828 Contact with and (suspected) exposure to other viral communicable diseases: Secondary | ICD-10-CM | POA: Insufficient documentation

## 2018-12-29 DIAGNOSIS — Z01812 Encounter for preprocedural laboratory examination: Secondary | ICD-10-CM | POA: Insufficient documentation

## 2018-12-29 LAB — SARS CORONAVIRUS 2 (TAT 6-24 HRS): SARS Coronavirus 2: NEGATIVE

## 2018-12-30 NOTE — Progress Notes (Signed)
Several unsuccessful attempts were made to contact pt. Nurse unable to lvm " voice mailbox is full." EC# 2 stated that they would attempt to contact pt; nurse provided contact number; nurse awaiting response.

## 2018-12-31 ENCOUNTER — Encounter (HOSPITAL_COMMUNITY): Admission: RE | Payer: Self-pay | Source: Home / Self Care

## 2018-12-31 ENCOUNTER — Ambulatory Visit (HOSPITAL_COMMUNITY): Admission: RE | Admit: 2018-12-31 | Payer: Medicare HMO | Source: Ambulatory Visit

## 2018-12-31 ENCOUNTER — Encounter (HOSPITAL_COMMUNITY): Payer: Self-pay | Admitting: Certified Registered"

## 2018-12-31 ENCOUNTER — Encounter (HOSPITAL_COMMUNITY): Payer: Self-pay

## 2018-12-31 ENCOUNTER — Ambulatory Visit (HOSPITAL_COMMUNITY): Admission: RE | Admit: 2018-12-31 | Payer: Medicare HMO | Source: Home / Self Care

## 2018-12-31 SURGERY — MRI WITH ANESTHESIA
Anesthesia: General

## 2019-01-19 ENCOUNTER — Other Ambulatory Visit: Payer: Self-pay

## 2019-01-19 ENCOUNTER — Ambulatory Visit (HOSPITAL_COMMUNITY): Payer: Medicare HMO

## 2019-01-19 ENCOUNTER — Other Ambulatory Visit (HOSPITAL_COMMUNITY)
Admission: RE | Admit: 2019-01-19 | Discharge: 2019-01-19 | Disposition: A | Discharge status: Home / Self Care | Payer: Medicare HMO | Source: Ambulatory Visit | Attending: Family Medicine | Admitting: Family Medicine

## 2019-01-19 DIAGNOSIS — Z20828 Contact with and (suspected) exposure to other viral communicable diseases: Secondary | ICD-10-CM | POA: Diagnosis not present

## 2019-01-19 DIAGNOSIS — Z01812 Encounter for preprocedural laboratory examination: Secondary | ICD-10-CM | POA: Diagnosis present

## 2019-01-19 LAB — SARS CORONAVIRUS 2 (TAT 6-24 HRS): SARS Coronavirus 2: NEGATIVE

## 2019-01-19 NOTE — Progress Notes (Addendum)
Patient denies shortness of breath, fever, cough and chest pain.  PCP - Darcus Austin, FNP Cardiologist - denies  Chest x-ray - denies EKG - DOS 01/21/19 Stress Test - denies ECHO - denies Cardiac Cath - denies  Anesthesia review: Yes  OSA - wears CPAP nightly.    STOP now taking any Aspirin (unless otherwise instructed by your surgeon), Aleve, Naproxen, Ibuprofen, Motrin, Advil, Goody's, BC's, all herbal medications, fish oil, and all vitamins.   Coronavirus Screening Covid test on 01/19/19 was negative.  Patient verbalized understanding of instructions that were given via phone.  Called Dr Jerrol Banana office [phone] requesting current H&P.  Office was closed today.  LM for Dr Chauncey Reading to fax [place] current H&P to [phone] on this patient.  Answering Service to call MD with this information.  My phone number was given if any questions/concerns.

## 2019-01-20 ENCOUNTER — Encounter (HOSPITAL_COMMUNITY): Payer: Self-pay | Admitting: *Deleted

## 2019-01-20 ENCOUNTER — Other Ambulatory Visit: Payer: Self-pay

## 2019-01-20 NOTE — Progress Notes (Signed)
Anesthesia Chart Review: Joy Thomas    Case: [id number] Date/Time: 01/21/19 0945   Procedure: MRI OF BRAIN WITH OUT CONTRAST WITH ANESTHESIA (N/A )   Anesthesia type: General   Pre-op diagnosis: DIZZINESS AND UNSTEADY GAIT   Location: [facility] OR ROOM 34 / [place] OR   Surgeon: Radiologist, Medication, MD      DISCUSSION: Patient is a 68 year old female scheduled for MRI of the brain under anesthesia due to severe claustrophobia.  MRI was ordered by Emelda Fear, DO. H&P completed by Tillman Abide, [place].   History includes smoking, HTN, hypercholesterolemia, colitis, concussion, RA, OSA (uses CPAP), GERD, fibromyalgia, murmur (reported years ago, but not recently heard). Treated for sinusitis 01/08/19.   COVID-19 test negative on 01/19/19. She is same day work-up, so updated labs, EKG on arrival. Anesthesia to evaluate prior to procedure.    VS: Ht 5' 8.5" (1.74 m)   Wt 74.8 kg   LMP  (LMP Unknown)   BMI 24.72 kg/m    PROVIDERS: Emelda Fear, DO is PCP ([place], [place]). Typically sees [place], [place], [place].   LABS: Day of surgery.    EKG: Day of surgery.   CV: She thinks she may have had an echo or stress many years ago, but unsure of when/where.   Past Medical History:  Diagnosis Date  . Anxiety   . Apnea    C pap  . Cataract    Bilateral - MD just watching  . Chronic interstitial cystitis   . Colitis   . Concussion   . Depression   . Fibromyalgia   . GERD (gastroesophageal reflux disease)   . Headache   . Heart murmur     " slight mumur years ago; doctors don't hear it now "  . High cholesterol   . Hypertension   . Neuropathy    patient denies this dx.  . Pneumonia   . Rheumatoid arthritis ([place])   . Sleep apnea    wears CPAP  . Vertigo     Past Surgical History:  Procedure Laterality Date  . CARPAL TUNNEL RELEASE    . COLONOSCOPY    . RADIOLOGY WITH ANESTHESIA N/A 05/28/2016   Procedure: MRI OF THE BRAIN WITH AND WITHOUT and  MRI OF CERVICAL SPINE;  Surgeon: Medication Radiologist, MD;  Location: [facility];  Service: Radiology;  Laterality: N/A;  . ROTATOR CUFF REPAIR    . TONSILLECTOMY      MEDICATIONS: No current facility-administered medications for this encounter.    [name] acetaminophen (TYLENOL) 500 MG tablet  . aspirin (ECOTRIN LOW STRENGTH) 81 MG EC tablet  . cetirizine (ZYRTEC) 10 MG tablet  . furosemide (LASIX) 20 MG tablet  . olmesartan-hydrochlorothiazide (BENICAR HCT) 20-12.5 MG tablet  . omeprazole (PRILOSEC) 20 MG capsule  . oxybutynin (DITROPAN) 5 MG tablet  . PARoxetine (PAXIL) 40 MG tablet  . potassium chloride SA (K-DUR,KLOR-CON) 20 MEQ tablet  . simvastatin (ZOCOR) 40 MG tablet  . triamcinolone cream (KENALOG) 0.1 %    Myra Gianotti, PA-C Surgical Short Stay/Anesthesiology [facility] Phone [phone] [facility] Phone [phone] 01/20/2019 12:44 PM

## 2019-01-20 NOTE — Anesthesia Preprocedure Evaluation (Addendum)
Anesthesia Evaluation  Patient identified by MRN, date of birth, ID band Patient awake    Reviewed: Allergy & Precautions, NPO status , Patient's Chart, lab work & pertinent test results  History of Anesthesia Complications Negative for: history of anesthetic complications  Airway Mallampati: II  TM Distance: >3 FB Neck ROM: Full    Dental  (+) Dental Advisory Given   Pulmonary sleep apnea and Continuous Positive Airway Pressure Ventilation , neg recent URI, Current Smoker,    breath sounds clear to auscultation       Cardiovascular hypertension, Pt. on medications (-) angina(-) Past MI and (-) CHF (-) dysrhythmias  Rhythm:Regular     Neuro/Psych  Headaches, PSYCHIATRIC DISORDERS Anxiety Depression  Neuromuscular disease    GI/Hepatic Neg liver ROS, GERD  Medicated and Controlled,  Endo/Other  negative endocrine ROS  Renal/GU negative Renal ROS     Musculoskeletal  (+) Arthritis , Fibromyalgia -  Abdominal   Peds  Hematology negative hematology ROS (+)   Anesthesia Other Findings   Reproductive/Obstetrics                           Anesthesia Physical Anesthesia Plan  ASA: II  Anesthesia Plan: General   Post-op Pain Management:    Induction: Intravenous  PONV Risk Score and Plan: 2 and Ondansetron and Dexamethasone  Airway Management Planned: Oral ETT and LMA  Additional Equipment: None  Intra-op Plan:   Post-operative Plan: Extubation in OR  Informed Consent: I have reviewed the patients History and Physical, chart, labs and discussed the procedure including the risks, benefits and alternatives for the proposed anesthesia with the patient or authorized representative who has indicated his/her understanding and acceptance.     Dental advisory given  Plan Discussed with: Surgeon and CRNA  Anesthesia Plan Comments: (PAT note written 01/20/2019 by Myra Gianotti, PA-C. )        Anesthesia Quick Evaluation

## 2019-01-21 ENCOUNTER — Encounter (HOSPITAL_COMMUNITY): Admission: RE | Disposition: A | Discharge status: Home / Self Care | Payer: Self-pay | Source: Home / Self Care

## 2019-01-21 ENCOUNTER — Ambulatory Visit (HOSPITAL_COMMUNITY)
Admission: RE | Admit: 2019-01-21 | Discharge: 2019-01-21 | Disposition: A | Discharge status: Home / Self Care | Payer: Medicare HMO | Source: Ambulatory Visit | Attending: Family Medicine | Admitting: Family Medicine

## 2019-01-21 ENCOUNTER — Ambulatory Visit (HOSPITAL_COMMUNITY): Payer: Medicare HMO | Admitting: Vascular Surgery

## 2019-01-21 ENCOUNTER — Ambulatory Visit (HOSPITAL_COMMUNITY): Payer: Medicare HMO

## 2019-01-21 ENCOUNTER — Encounter (HOSPITAL_COMMUNITY): Payer: Self-pay

## 2019-01-21 ENCOUNTER — Ambulatory Visit (HOSPITAL_COMMUNITY)
Admission: RE | Admit: 2019-01-21 | Discharge: 2019-01-21 | Disposition: A | Discharge status: Home / Self Care | Payer: Medicare HMO | Attending: Family Medicine | Admitting: Family Medicine

## 2019-01-21 DIAGNOSIS — Z79899 Other long term (current) drug therapy: Secondary | ICD-10-CM | POA: Diagnosis not present

## 2019-01-21 DIAGNOSIS — F40240 Claustrophobia: Secondary | ICD-10-CM | POA: Diagnosis not present

## 2019-01-21 DIAGNOSIS — Z7982 Long term (current) use of aspirin: Secondary | ICD-10-CM | POA: Insufficient documentation

## 2019-01-21 DIAGNOSIS — M069 Rheumatoid arthritis, unspecified: Secondary | ICD-10-CM | POA: Insufficient documentation

## 2019-01-21 DIAGNOSIS — F419 Anxiety disorder, unspecified: Secondary | ICD-10-CM | POA: Insufficient documentation

## 2019-01-21 DIAGNOSIS — M797 Fibromyalgia: Secondary | ICD-10-CM | POA: Insufficient documentation

## 2019-01-21 DIAGNOSIS — K219 Gastro-esophageal reflux disease without esophagitis: Secondary | ICD-10-CM | POA: Diagnosis not present

## 2019-01-21 DIAGNOSIS — G4733 Obstructive sleep apnea (adult) (pediatric): Secondary | ICD-10-CM | POA: Insufficient documentation

## 2019-01-21 DIAGNOSIS — R42 Dizziness and giddiness: Secondary | ICD-10-CM | POA: Insufficient documentation

## 2019-01-21 DIAGNOSIS — R2681 Unsteadiness on feet: Secondary | ICD-10-CM

## 2019-01-21 DIAGNOSIS — E7800 Pure hypercholesterolemia, unspecified: Secondary | ICD-10-CM | POA: Insufficient documentation

## 2019-01-21 DIAGNOSIS — I10 Essential (primary) hypertension: Secondary | ICD-10-CM | POA: Insufficient documentation

## 2019-01-21 DIAGNOSIS — F329 Major depressive disorder, single episode, unspecified: Secondary | ICD-10-CM | POA: Insufficient documentation

## 2019-01-21 HISTORY — PX: RADIOLOGY WITH ANESTHESIA: SHX6223

## 2019-01-21 LAB — BASIC METABOLIC PANEL
Anion gap: 11 (ref 5–15)
BUN: 10 mg/dL (ref 8–23)
CO2: 23 mmol/L (ref 22–32)
Calcium: 8.8 mg/dL — ABNORMAL LOW (ref 8.9–10.3)
Chloride: 109 mmol/L (ref 98–111)
Creatinine, Ser: 0.8 mg/dL (ref 0.44–1.00)
GFR calc Af Amer: >60 mL/min (ref >60)
GFR calc non Af Amer: >60 mL/min (ref >60)
Glucose, Bld: 98 mg/dL (ref 70–99)
Potassium: 3.7 mmol/L (ref 3.5–5.1)
Sodium: 143 mmol/L (ref 135–145)

## 2019-01-21 SURGERY — MRI WITH ANESTHESIA
Anesthesia: General

## 2019-01-21 MED ORDER — LACTATED RINGERS IV SOLN
INTRAVENOUS | Status: DC
  Administered 2019-01-21 (×2): via INTRAVENOUS

## 2019-01-21 MED ORDER — PROPOFOL 10 MG/ML IV BOLUS
INTRAVENOUS | Status: DC | PRN
  Administered 2019-01-21: 200 mg via INTRAVENOUS

## 2019-01-21 MED ORDER — LIDOCAINE 2% (20 MG/ML) 5 ML SYRINGE
INTRAMUSCULAR | Status: DC | PRN
  Administered 2019-01-21: 60 mg via INTRAVENOUS

## 2019-01-21 NOTE — Progress Notes (Addendum)
Called Dr. Jerrol Banana office for H&P.    Got answering service.  Told receptionist that H&P was supposed to be sent yesterday and it was never received, and is needed ASAP.   Per receptionist, will pass message along and notify physician.     Dr. Ermalene Postin notified.  MRI notified.

## 2019-01-21 NOTE — Anesthesia Procedure Notes (Signed)
Procedure Name: LMA Insertion Date/Time: 01/21/2019 10:35 AM Performed by: Babs Bertin, CRNA Pre-anesthesia Checklist: Patient identified, Emergency Drugs available, Suction available and Patient being monitored Patient Re-evaluated:Patient Re-evaluated prior to induction Oxygen Delivery Method: Circle System Utilized Preoxygenation: Pre-oxygenation with 100% oxygen Induction Type: IV induction Ventilation: Mask ventilation without difficulty LMA: LMA inserted LMA Size: 4.0 Number of attempts: 1 Airway Equipment and Method: Bite block Placement Confirmation: positive ETCO2 Tube secured with: Tape Dental Injury: Teeth and Oropharynx as per pre-operative assessment

## 2019-01-21 NOTE — Transfer of Care (Signed)
Immediate Anesthesia Transfer of Care Note  Patient: Joy Thomas  Procedure(s) Performed: MRI OF BRAIN WITH OUT CONTRAST WITH ANESTHESIA (N/A )  Patient Location: PACU  Anesthesia Type:General  Level of Consciousness: awake, alert  and oriented  Airway & Oxygen Therapy: Patient Spontanous Breathing and Patient connected to nasal cannula oxygen  Post-op Assessment: Report given to RN and Post -op Vital signs reviewed and stable  Post vital signs: Reviewed and stable  Last Vitals:  Vitals Value Taken Time  BP 132/87 01/21/19 1207  Temp 36.6 C 01/21/19 1207  Pulse 60 01/21/19 1208  Resp 14 01/21/19 1208  SpO2 92 % 01/21/19 1208  Vitals shown include unvalidated device data.  Last Pain:  Vitals:   01/21/19 0816  TempSrc:   PainSc: 0-No pain         Complications: No apparent anesthesia complications

## 2019-01-22 ENCOUNTER — Encounter (HOSPITAL_COMMUNITY): Payer: Self-pay | Admitting: Radiology

## 2019-01-24 NOTE — Anesthesia Postprocedure Evaluation (Signed)
Anesthesia Post Note  Patient: Joy Thomas  Procedure(s) Performed: MRI OF BRAIN WITH OUT CONTRAST WITH ANESTHESIA (N/A )     Patient location during evaluation: PACU Anesthesia Type: General Level of consciousness: awake and alert Pain management: pain level controlled Vital Signs Assessment: post-procedure vital signs reviewed and stable Respiratory status: spontaneous breathing, nonlabored ventilation, respiratory function stable and patient connected to nasal cannula oxygen Cardiovascular status: blood pressure returned to baseline and stable Postop Assessment: no apparent nausea or vomiting Anesthetic complications: no    Last Vitals:  Vitals:   01/21/19 1252 01/21/19 1300  BP: (!) 147/70   Pulse: (!) 56 (!) 56  Resp: 12 10  Temp:  36.5 C  SpO2: 94% 94%    Last Pain:  Vitals:   01/21/19 1237  TempSrc:   PainSc: 0-No pain                 Melayah Skorupski

## 2019-10-07 ENCOUNTER — Other Ambulatory Visit: Payer: Self-pay | Admitting: Family Medicine

## 2019-10-07 DIAGNOSIS — Z1231 Encounter for screening mammogram for malignant neoplasm of breast: Secondary | ICD-10-CM

## 2020-01-10 ENCOUNTER — Other Ambulatory Visit (HOSPITAL_COMMUNITY): Payer: Self-pay | Admitting: Pain Medicine

## 2020-01-10 DIAGNOSIS — M5412 Radiculopathy, cervical region: Secondary | ICD-10-CM

## 2020-02-01 ENCOUNTER — Encounter (HOSPITAL_COMMUNITY): Payer: Self-pay

## 2020-02-01 ENCOUNTER — Ambulatory Visit: Admit: 2020-02-01 | Payer: Medicare HMO

## 2020-02-01 ENCOUNTER — Ambulatory Visit (HOSPITAL_COMMUNITY): Payer: Medicare HMO

## 2020-02-01 SURGERY — MRI WITH ANESTHESIA
Anesthesia: General

## 2020-06-13 ENCOUNTER — Other Ambulatory Visit: Payer: Self-pay

## 2020-06-13 ENCOUNTER — Ambulatory Visit (HOSPITAL_COMMUNITY)
Admission: EM | Admit: 2020-06-13 | Discharge: 2020-06-13 | Disposition: A | Discharge status: Home / Self Care | Payer: Medicare HMO | Attending: Nurse Practitioner | Admitting: Nurse Practitioner

## 2020-06-13 DIAGNOSIS — F411 Generalized anxiety disorder: Secondary | ICD-10-CM | POA: Insufficient documentation

## 2020-06-13 NOTE — BH Assessment (Addendum)
Grant is a 70yo female reporting as a walk in to [facility] for assessment of continued panic attacks. Pt reported initially that she was here for "inpatient treatment" from [place], [place] and brought several bags full of clothing/items with her. Jerene Dilling has appointments with [facility] in [place]. Pt reports that she is currently not under the treatment of a psychiatric provider.  Pt states that she has significant GI issues, and her physicians have recommended that she get treatment for anxiety/panic. Pt states that she had panic attacks "back in the 80's" that resolved until December 2021. Pt reports that symptom escalated at that time and that she is suffering with panic attacks/GI issues. Pt reports that she has lost 40 lbs in 4 months after recently being put on FODMAP diet. Pt is currently taking Buspar to manage anxiety. Pt reports that she took xanax back in the 80's. Pt denies any SI, HI, AVH, or paranoid ideation at time of triage. Pt presenting with organized thought process.  Pt presenting with restless, tearful affect.  Flowsheet Row ED from 06/13/2020 in [facility]  C-SSRS RISK CATEGORY No Risk       Routine  Weyman Pedro, MSW, LCSW Outpatient Therapist/Triage Specialist

## 2020-06-13 NOTE — Discharge Instructions (Signed)
°  Discharge recommendations:  °Patient is to take medications as prescribed. °Please see information for follow-up appointment with psychiatry and therapy. °Please follow up with your primary care provider for all medical related needs.  ° °Therapy: We recommend that patient participate in individual therapy to address mental health concerns. ° ° ° ° °Safety:  °The patient should abstain from use of illicit substances/drugs and abuse of any medications. °If symptoms worsen or do not continue to improve or if the patient becomes actively suicidal or homicidal then it is recommended that the patient return to the closest hospital emergency department, the [facility], or call 911 for further evaluation and treatment. °National Suicide Prevention Lifeline 1-800-SUICIDE or 1-800-273-8255.  °

## 2020-06-13 NOTE — ED Provider Notes (Signed)
Behavioral Health Urgent Care Medical Screening Exam  Patient Name: Joy Thomas MRN: [id number] Date of Evaluation: 06/13/20 Chief Complaint:   Diagnosis:  Final diagnoses:  GAD (generalized anxiety disorder)    History of Present illness: Joy Thomas is a 70 y.o. female with a history of GAD, Panic Attacks and Agoraphobia who presents voluntarily to [facility] requesting inpatient treatment for anxiety. Patient states that she lives in [place], [place] and someone told her that she could come to [facility] for a an assessment and that she would be admitted. Patient states that she was diagnosed with panic attacks and agoraphobia  in the 1980's, but she eventually improved. Reports that anxiety has recently worsened over the past few weeks. States that she was recently prescribed clonazepam, but she is afraid to take it since it can be addictive. Patient states that she lives alone and depends on friends to provide transportation. She states that she is involved with the [employer] and political committees in the [place], [place] area. States that she met with a therapist at [facility] in [place] last week and that she has an upcoming appointment.   On evaluation patient is alert and oriented x 4, pleasant, and cooperative. Speech is clear and coherent. Mood is anxious and affect is congruent with mood. Thought process is coherent and thought content is logical. Denies auditory and visual hallucinations. No indication that patient is responding to internal stimuli. No evidence of delusional thought content. Denies suicidal ideations. Denies homicidal ideations. Denies substance abuse. She is goal directed and future oriented.   Patient became irritable when she was informed that she did not meet inpatient treatment criteria or continuous assessment criteria. She states that her friends dropped her off at [facility] and left to go back to [place]. Patient was able to make contact with her friends who agreed to return  and provide transporation back home.   Psychiatric Specialty Exam  Presentation  General Appearance:Appropriate for Environment; Well Groomed  Eye Contact:Good  Speech:Clear and Coherent; Normal Rate  Speech Volume:Normal  Handedness:No data recorded  Mood and Affect  Mood:Anxious  Affect:Congruent   Thought Process  Thought Processes:Coherent; Goal Directed; Linear  Descriptions of Associations:Intact  Orientation:Full (Time, Place and Person)  Thought Content:WDL    Hallucinations:None  Ideas of Reference:None  Suicidal Thoughts:No  Homicidal Thoughts:No   Sensorium  Memory:Immediate Good; Recent Good; Remote Good  Judgment:Good  Insight:Fair   Executive Functions  Concentration:Good  Attention Span:Good  [place]  Language:Good   Psychomotor Activity  Psychomotor Activity:Normal   Assets  Assets:Communication Skills; Desire for Improvement; Financial Resources/Insurance; Housing; Resilience; Social Support; Transportation   Sleep  Sleep:Fair  Number of hours: No data recorded  No data recorded  Physical Exam: Physical Exam Constitutional:      General: She is not in acute distress.    Appearance: She is not ill-appearing, toxic-appearing or diaphoretic.  HENT:     Head: Normocephalic.     Right Ear: External ear normal.     Left Ear: External ear normal.  Eyes:     Conjunctiva/sclera: Conjunctivae normal.     Pupils: Pupils are equal, round, and reactive to light.  Cardiovascular:     Rate and Rhythm: Normal rate.  Pulmonary:     Effort: Pulmonary effort is normal. No respiratory distress.  Musculoskeletal:        General: Normal range of motion.  Skin:    General: Skin is warm and dry.  Neurological:  Mental Status: She is alert and oriented to person, place, and time.  Psychiatric:        Mood and Affect: Mood is anxious.        Thought Content: Thought content is not paranoid or  delusional. Thought content does not include homicidal or suicidal ideation.    Review of Systems  Constitutional: Negative for chills, diaphoresis, fever, malaise/fatigue and weight loss.  HENT: Negative for congestion.   Respiratory: Negative for cough and shortness of breath.   Cardiovascular: Negative for chest pain and palpitations.  Gastrointestinal: Negative for nausea and vomiting.  Neurological: Negative for dizziness and seizures.  Psychiatric/Behavioral: Negative for depression, hallucinations, memory loss, substance abuse and suicidal ideas. The patient is nervous/anxious and has insomnia.   All other systems reviewed and are negative.  Blood pressure (!) 144/88, pulse 85, temperature 98.4 F (36.9 C), temperature source Oral, resp. rate 16, SpO2 100 %. There is no height or weight on file to calculate BMI.  Musculoskeletal: Strength & Muscle Tone: within normal limits Gait & Station: normal Patient leans: N/A   [facility] MSE Discharge Disposition for Follow up and Recommendations: Based on my evaluation the patient does not appear to have an emergency medical condition and can be discharged with resources and follow up care in outpatient services for Medication Management and Individual Therapy   Continue to follow up with [facility]   Jason A Berry, NP 06/13/2020, 8:28 PM 

## 2020-12-17 ENCOUNTER — Emergency Department (HOSPITAL_COMMUNITY)
Admission: EM | Admit: 2020-12-17 | Discharge: 2020-12-18 | Disposition: A | Discharge status: Home / Self Care | Payer: Medicare HMO | Attending: Emergency Medicine | Admitting: Emergency Medicine

## 2020-12-17 ENCOUNTER — Other Ambulatory Visit: Payer: Self-pay

## 2020-12-17 ENCOUNTER — Encounter (HOSPITAL_COMMUNITY): Payer: Self-pay

## 2020-12-17 DIAGNOSIS — F17210 Nicotine dependence, cigarettes, uncomplicated: Secondary | ICD-10-CM | POA: Diagnosis not present

## 2020-12-17 DIAGNOSIS — I10 Essential (primary) hypertension: Secondary | ICD-10-CM | POA: Insufficient documentation

## 2020-12-17 DIAGNOSIS — F411 Generalized anxiety disorder: Secondary | ICD-10-CM | POA: Diagnosis not present

## 2020-12-17 DIAGNOSIS — F322 Major depressive disorder, single episode, severe without psychotic features: Secondary | ICD-10-CM | POA: Diagnosis present

## 2020-12-17 DIAGNOSIS — Z79899 Other long term (current) drug therapy: Secondary | ICD-10-CM | POA: Insufficient documentation

## 2020-12-17 DIAGNOSIS — Z7982 Long term (current) use of aspirin: Secondary | ICD-10-CM | POA: Insufficient documentation

## 2020-12-17 DIAGNOSIS — Z8616 Personal history of COVID-19: Secondary | ICD-10-CM | POA: Diagnosis not present

## 2020-12-17 DIAGNOSIS — Z20822 Contact with and (suspected) exposure to covid-19: Secondary | ICD-10-CM | POA: Insufficient documentation

## 2020-12-17 DIAGNOSIS — Z1339 Encounter for screening examination for other mental health and behavioral disorders: Secondary | ICD-10-CM | POA: Diagnosis not present

## 2020-12-17 DIAGNOSIS — R45851 Suicidal ideations: Secondary | ICD-10-CM | POA: Insufficient documentation

## 2020-12-17 DIAGNOSIS — J45909 Unspecified asthma, uncomplicated: Secondary | ICD-10-CM | POA: Insufficient documentation

## 2020-12-17 LAB — RESP PANEL BY RT-PCR (FLU A&B, COVID) ARPGX2
Influenza A by PCR: NEGATIVE
Influenza B by PCR: NEGATIVE
SARS Coronavirus 2 by RT PCR: NEGATIVE

## 2020-12-17 LAB — COMPREHENSIVE METABOLIC PANEL
ALT: 22 U/L (ref 0–44)
AST: 25 U/L (ref 15–41)
Albumin: 4.3 g/dL (ref 3.5–5.0)
Alkaline Phosphatase: 71 U/L (ref 38–126)
Anion gap: 8 (ref 5–15)
BUN: 16 mg/dL (ref 8–23)
CO2: 25 mmol/L (ref 22–32)
Calcium: 9.3 mg/dL (ref 8.9–10.3)
Chloride: 99 mmol/L (ref 98–111)
Creatinine, Ser: 0.61 mg/dL (ref 0.44–1.00)
GFR, Estimated: >60 mL/min (ref >60)
Glucose, Bld: 129 mg/dL — ABNORMAL HIGH (ref 70–99)
Potassium: 4 mmol/L (ref 3.5–5.1)
Sodium: 132 mmol/L — ABNORMAL LOW (ref 135–145)
Total Bilirubin: 1.2 mg/dL (ref 0.3–1.2)
Total Protein: 7.6 g/dL (ref 6.5–8.1)

## 2020-12-17 LAB — CBC WITH DIFFERENTIAL/PLATELET
Abs Immature Granulocytes: 0.02 10*3/uL (ref 0.00–0.07)
Basophils Absolute: 0 10*3/uL (ref 0.0–0.1)
Basophils Relative: 0 %
Eosinophils Absolute: 0.1 10*3/uL (ref 0.0–0.5)
Eosinophils Relative: 1 %
HCT: 38 % (ref 36.0–46.0)
Hemoglobin: 12 g/dL (ref 12.0–15.0)
Immature Granulocytes: 0 %
Lymphocytes Relative: 21 %
Lymphs Abs: 1.7 10*3/uL (ref 0.7–4.0)
MCH: 28.6 pg (ref 26.0–34.0)
MCHC: 31.6 g/dL (ref 30.0–36.0)
MCV: 90.5 fL (ref 80.0–100.0)
Monocytes Absolute: 0.4 10*3/uL (ref 0.1–1.0)
Monocytes Relative: 5 %
Neutro Abs: 5.9 10*3/uL (ref 1.7–7.7)
Neutrophils Relative %: 73 %
Platelets: 367 10*3/uL (ref 150–400)
RBC: 4.2 MIL/uL (ref 3.87–5.11)
RDW: 13.1 % (ref 11.5–15.5)
WBC: 8.2 10*3/uL (ref 4.0–10.5)
nRBC: 0 % (ref 0.0–0.2)

## 2020-12-17 LAB — RAPID URINE DRUG SCREEN, HOSP PERFORMED
Amphetamines: NOT DETECTED
Barbiturates: NOT DETECTED
Benzodiazepines: NOT DETECTED
Cocaine: NOT DETECTED
Opiates: NOT DETECTED
Tetrahydrocannabinol: NOT DETECTED

## 2020-12-17 LAB — ETHANOL: Alcohol, Ethyl (B): <10 mg/dL (ref <10)

## 2020-12-17 MED ORDER — HYDROCHLOROTHIAZIDE 12.5 MG PO CAPS
12.5000 mg | ORAL_CAPSULE | Freq: Every day | ORAL | Status: DC
  Administered 2020-12-17 – 2020-12-18 (×2): 12.5 mg via ORAL
  Filled 2020-12-17 (×2): qty 1

## 2020-12-17 MED ORDER — POLYETHYLENE GLYCOL 3350 17 G PO PACK
17.0000 g | PACK | Freq: Every day | ORAL | Status: DC | PRN
  Filled 2020-12-17: qty 1

## 2020-12-17 MED ORDER — SIMETHICONE 80 MG PO CHEW
80.0000 mg | CHEWABLE_TABLET | Freq: Four times a day (QID) | ORAL | Status: DC | PRN
  Filled 2020-12-17: qty 2

## 2020-12-17 MED ORDER — DOCUSATE SODIUM 100 MG PO CAPS: 100.0000 mg | ORAL_CAPSULE | Freq: Every day | ORAL | Status: DC | PRN

## 2020-12-17 MED ORDER — ASPIRIN 81 MG PO TBEC: 81.0000 mg | DELAYED_RELEASE_TABLET | Freq: Every day | ORAL | Status: DC

## 2020-12-17 MED ORDER — IRBESARTAN 150 MG PO TABS
150.0000 mg | ORAL_TABLET | Freq: Every day | ORAL | Status: DC
  Administered 2020-12-17 – 2020-12-18 (×2): 150 mg via ORAL
  Filled 2020-12-17 (×3): qty 1

## 2020-12-17 MED ORDER — BUSPIRONE HCL 10 MG PO TABS
15.0000 mg | ORAL_TABLET | Freq: Three times a day (TID) | ORAL | Status: DC
  Administered 2020-12-17: 15 mg via ORAL
  Filled 2020-12-17: qty 2

## 2020-12-17 MED ORDER — ZOLPIDEM TARTRATE 5 MG PO TABS: 5.0000 mg | ORAL_TABLET | Freq: Every evening | ORAL | Status: DC | PRN

## 2020-12-17 MED ORDER — POTASSIUM CHLORIDE CRYS ER 20 MEQ PO TBCR
40.0000 meq | EXTENDED_RELEASE_TABLET | Freq: Once | ORAL | Status: AC
  Administered 2020-12-17: 40 meq via ORAL
  Filled 2020-12-17: qty 2

## 2020-12-17 MED ORDER — DICYCLOMINE HCL 10 MG PO CAPS
10.0000 mg | ORAL_CAPSULE | Freq: Four times a day (QID) | ORAL | Status: DC | PRN
  Filled 2020-12-17: qty 1

## 2020-12-17 MED ORDER — ATORVASTATIN CALCIUM 10 MG PO TABS
20.0000 mg | ORAL_TABLET | Freq: Every day | ORAL | Status: DC
  Administered 2020-12-17: 20 mg via ORAL
  Filled 2020-12-17: qty 2

## 2020-12-17 MED ORDER — ONDANSETRON HCL 4 MG PO TABS: 4.0000 mg | ORAL_TABLET | Freq: Three times a day (TID) | ORAL | Status: DC | PRN

## 2020-12-17 MED ORDER — VENLAFAXINE HCL 75 MG PO TABS: 75.0000 mg | ORAL_TABLET | Freq: Once | ORAL | Status: DC

## 2020-12-17 MED ORDER — FAMOTIDINE 20 MG PO TABS
20.0000 mg | ORAL_TABLET | Freq: Two times a day (BID) | ORAL | Status: DC
  Administered 2020-12-17 – 2020-12-18 (×3): 20 mg via ORAL
  Filled 2020-12-17 (×3): qty 1

## 2020-12-17 MED ORDER — ONDANSETRON 8 MG PO TBDP: 8.0000 mg | ORAL_TABLET | Freq: Three times a day (TID) | ORAL | Status: DC | PRN

## 2020-12-17 MED ORDER — BUSPIRONE HCL 10 MG PO TABS
15.0000 mg | ORAL_TABLET | Freq: Three times a day (TID) | ORAL | Status: DC
  Administered 2020-12-17 – 2020-12-18 (×3): 15 mg via ORAL
  Filled 2020-12-17 (×3): qty 2

## 2020-12-17 MED ORDER — PANTOPRAZOLE SODIUM 40 MG PO TBEC
40.0000 mg | DELAYED_RELEASE_TABLET | Freq: Every day | ORAL | Status: DC
  Administered 2020-12-17 – 2020-12-18 (×2): 40 mg via ORAL
  Filled 2020-12-17 (×2): qty 1

## 2020-12-17 MED ORDER — SIMETHICONE 40 MG/0.6ML PO SUSP (UNIT DOSE)
40.0000 mg | Freq: Once | ORAL | Status: DC
  Filled 2020-12-17: qty 0.6

## 2020-12-17 MED ORDER — ATORVASTATIN CALCIUM 10 MG PO TABS
20.0000 mg | ORAL_TABLET | Freq: Every day | ORAL | Status: DC
  Administered 2020-12-18: 20 mg via ORAL
  Filled 2020-12-17: qty 2

## 2020-12-17 MED ORDER — ACETAMINOPHEN 500 MG PO TABS: 1000.0000 mg | ORAL_TABLET | Freq: Three times a day (TID) | ORAL | Status: DC | PRN

## 2020-12-17 MED ORDER — VITAMIN B-12 1000 MCG PO TABS
1000.0000 ug | ORAL_TABLET | Freq: Every day | ORAL | Status: DC
  Administered 2020-12-17 – 2020-12-18 (×2): 1000 ug via ORAL
  Filled 2020-12-17 (×3): qty 1

## 2020-12-17 MED ORDER — DESIPRAMINE HCL 25 MG PO TABS
25.0000 mg | ORAL_TABLET | Freq: Every day | ORAL | Status: DC
  Filled 2020-12-17 (×2): qty 1

## 2020-12-17 MED ORDER — OLMESARTAN MEDOXOMIL-HCTZ 20-12.5 MG PO TABS: 0.5000 | ORAL_TABLET | Freq: Every day | ORAL | Status: DC

## 2020-12-17 MED ORDER — VENLAFAXINE HCL ER 37.5 MG PO CP24
37.5000 mg | ORAL_CAPSULE | Freq: Every day | ORAL | Status: DC
  Filled 2020-12-17 (×3): qty 1

## 2020-12-17 NOTE — ED Notes (Signed)
Pt reports she requires her C-PAP machine to sleep unit placed in room in direct site of staff.

## 2020-12-17 NOTE — ED Triage Notes (Addendum)
Pt has been seen in [place] by therapist for anxiety, depression. Pt states she has been having crazy dreams and waking up with a start. Pt states she woke up and was unable to go back to sleep, leaving her extremely frightened. Pt states she is having some suicidal thoughts as well. No plan to act on them, but she is concerned as she does have the means . Pt has pills that she has tried for depression in the past that are sitting in her medicine cabinet.

## 2020-12-17 NOTE — ED Notes (Signed)
Two labeled patient belongings bag transferred with patient.

## 2020-12-17 NOTE — BH Assessment (Addendum)
Comprehensive Clinical Assessment (CCA) Note  12/17/2020 Joy Thomas [id number]  Disposition: TTS completed. Per Maxie Barb, NP, patient is psych cleared. Patient recommended to follow up with her current NP for continued med management. Also, Partial Hospitalization and/or med management. Referral information has been added to patient's chart/AVS by the Disposition Child psychotherapist.  Updated Disposition Note: Per nursing Norberta Keens, RN), attempted to discharge patient. However, she threatened to harm herself if discharged home. The [facility] provider was given updates and changed the disposition to overnight observation. Pending am psych eval.   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 Pt reported initially that she was here for "individual and group therapy" from [place], [place]. States that the Emergency Department doesn't provide those services in [place], [place]. Per patient, "They just assess me, keep me in a room, don't give me medications, send me to a hospital for inpatient treatment".  Therefore, patient paid a transportation company to transport her from [place], [place] to [place], [place], for the cost of $220.   Patient reports being admitted to many hospitals in the past including [facility] in [place], [place], [facility] in [place], [place], and [facility] located in [place], [place]. She indicates that it's many more but that's all she can remember at this moment. Last hospitalization reported was 1 month ago, "a place in [place] but I can't remember the name of the place".  According to patient none of her inpatient hospitalizations have been helpful. She describes them as "a waste of time".  Patient speaks of a significant hx of  anxiety starting in the 1980's. She was able to cope with her anxiety for many years, until the last year. Today, she says that her anxiety is unbearable and she fearful of the attacks. Her anxiety became severe in the past year. She is unable to identify any triggers for her depression. Panic attacks are also daily. Last panic attack was this morning as she was waking up. Additionally, woke up feeling altered, confused, "I didn't know what day it was nor did I know the season". Denies this has ever happened in the past. However, the situation scared her and prompted her ED visit today.   Patient with suicidal ideations. No plan. No intent. No hx of suicide attempts and/or gestures. Denies hx of self-mutilating behaviors. Denies a family hx of mental health illnesses. Denies access to means such as firearms. Denies a hx of trauma and/or abuse. Appetite is poor but due to health issues (gall bladder). She sleeps in 1-2 intervals. States that her anxiety wakes her up. She reports losing 60 pounds in 6 months. Patient denies homicidal ideations.  No plan and/or intent. No legal issues. Denies AVH's. Denies alcohol and/or drug use.   Patient reports the following depressive symptoms: hopelessness, isolating self from others, guilt, irritability, and loss of interest in usual pleasures. States that she was heavily involved in a charity and community projects. However, due to her reported issues with panic attacks she is no longer able to do the things that she enjoys.   Patient lives in [place], [place], alone with her pet cats. She is single and retired. 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 She has a lot of support from "girl friends" and "my  younger brother. Raised by her mother.   She sees Adolphus Birchwood, NP, for medication management and has no therapist. She was last seen by a therapist in the 1980's. However, has not discussed with her therapist any of her symptoms as noted above.   CCA  Screening, Triage and Referral (STR)  Patient Reported Information How did you hear about [place]? Self  What Is the Reason for Your Visit/Call Today? Joy Thomas is a 70yo female presenting to the Emergency Department for continued panic attacks. Pt reported initially that she was here for "individual and group therapy" from [place], [place]. States that the Emergency Department doesn't provide those services in [place], [place]. Per patient, "They just assess me, keep me in a room, don't give me medications, send me to a hospital for inpatient treatment".  Therefore, patient paid a transportation company to transport her from [place], [place] to [place], [place], for the cost of $220.   Patient reports being admitted to many hospitals in the past including [facility] in [place], [place], [facility] in [place], [place], and [facility] located in [place], [place]. She indicates that it's many more but that's all she can remember at this moment. Last hospitalization reported was 1 month ago, "a place in [place] but I can't remember the name of the place".  According to patient none of her inpatient hospitalizations have been helpful. She describes them as "a waste of time".   Patient speaks of a significant hx of anxiety starting in the 1980's. She was able to cope with her anxiety for many years, until the last year. Today, she says that her anxiety is unbearable and she fearful of the attacks. Her anxiety became severe in the past year. She is unable to identify any triggers for her depression. Panic attacks are also daily. Last panic attack was this morning as she was waking up. Additionally, woke up feeling altered, confused, "I didn't know what day it was nor did I know the season". Denies this has ever happened in the past. However, the situation scared her and prompted her ED visit today.   Patient with suicidal ideations. No plan. No intent. No hx of suicide attempts and/or gestures. Denies hx of self-mutilating behaviors. Denies a  family hx of mental health illnesses. Denies access to means such as firearms. Denies a hx of trauma and/or abuse. Patient denies homicidal ideations.  No plan and/or intent. No legal issues. Denies AVH's. Denies alcohol and/or drug use.  How Long Has This Been Causing You Problems? 1 wk - 1 month  What Do You Feel Would Help You the Most Today? Treatment for Depression or other mood problem; Stress Management; Medication(s)   Have You Recently Had Any Thoughts About Hurting Yourself? Yes  Are You Planning to Commit Suicide/Harm Yourself At This time? No   Have you Recently Had Thoughts About Hurting Someone Karolee Ohs? No  Are You Planning to Harm Someone at This Time? No  Explanation: No data recorded  Have You Used Any Alcohol or Drugs in the Past 24 Hours? No  How Long Ago Did You Use Drugs or Alcohol? No data recorded What Did You Use and How Much? No data recorded  Do You Currently Have a Therapist/Psychiatrist? Yes  Name of Therapist/Psychiatrist: She sees Adolphus Birchwood, NP, for medication management and has no therapist. She was last seen by a therapist in the 1980's. However, has not discussed with her therapist any of her symptoms as noted above.   Have You Been Recently Discharged From Any  Public relations account executive or Programs? No  Explanation of Discharge From Practice/Program: No data recorded    CCA Screening Triage Referral Assessment Type of Contact: Tele-Assessment  Telemedicine Service Delivery:   Is this Initial or Reassessment? Initial Assessment  Date Telepsych consult ordered in [facility]:  12/17/20  Time Telepsych consult ordered in [facility]:  No data recorded Location of Assessment: [facility]  Provider Location: [facility]   Collateral Involvement: No data recorded  Does Patient Have a Court Appointed Legal Guardian? No data recorded Name and Contact of Legal Guardian: No data recorded If Minor and Not Living with Parent(s), Who has Custody? No data  recorded Is CPS involved or ever been involved? Never  Is APS involved or ever been involved? Never   Patient Determined To Be At Risk for Harm To Self or Others Based on Review of Patient Reported Information or Presenting Complaint? No  Method: No data recorded Availability of Means: No data recorded Intent: No data recorded Notification Required: No data recorded Additional Information for Danger to Others Potential: No data recorded Additional Comments for Danger to Others Potential: No data recorded Are There Guns or Other Weapons in Your Home? No data recorded Types of Guns/Weapons: No data recorded Are These Weapons Safely Secured?                            No data recorded Who Could Verify You Are Able To Have These Secured: No data recorded Do You Have any Outstanding Charges, Pending Court Dates, Parole/Probation? No data recorded Contacted To Inform of Risk of Harm To Self or Others: No data recorded   Does Patient Present under Involuntary Commitment? No  IVC Papers Initial File Date: No data recorded  [place] of Residence: [place]   Patient Currently Receiving the Following Services: Medication Management   Determination of Need: Routine (7 days)   Options For Referral: Intensive Outpatient Therapy; Medication Management; Partial Hospitalization; Outpatient Therapy     CCA Biopsychosocial Patient Reported Schizophrenia/Schizoaffective Diagnosis in Past: No   Strengths: No data recorded  Mental Health Symptoms Depression:   Difficulty Concentrating; Hopelessness; Irritability; Change in energy/activity; Sleep (too much or little); Tearfulness; Fatigue; Increase/decrease in appetite; Worthlessness; Weight gain/loss   Duration of Depressive symptoms:  Duration of Depressive Symptoms: Greater than two weeks   Mania:   None   Anxiety:    Irritability; Restlessness; Sleep; Tension; Difficulty concentrating; Fatigue; Worrying   Psychosis:   None    Duration of Psychotic symptoms:    Trauma:   None   Obsessions:   None   Compulsions:   None   Inattention:   Disorganized; Forgetful   Hyperactivity/Impulsivity:   None   Oppositional/Defiant Behaviors:   None   Emotional Irregularity:   None   Other Mood/Personality Symptoms:   Patient reports the following depressive symptoms: hopelessness, isolating self from others, guilt, irritability, and loss of interest in usual pleasures. States that she was heavily involved in a charity and community projects. However, due to her reported issues with panic attacks she is no longer able to do the things that she enjoys. She was calm and cooperative in today's assessment.    Mental Status Exam Appearance and self-care  Stature:   Average   Weight:   Average weight   Clothing:  No data recorded  Grooming:   Normal   Cosmetic use:   Age appropriate   Posture/gait:   Normal   Motor  activity:   Not Remarkable   Sensorium  Attention:   Normal   Concentration:   Normal   Orientation:   Time; Situation; Place; Person; Object   Recall/memory:   Normal   Affect and Mood  Affect:   Appropriate   Mood:   Depressed; Anxious; Irritable   Relating  Eye contact:   Normal   Facial expression:   Depressed; Anxious   Attitude toward examiner:   Cooperative   Thought and Language  Speech flow:  Clear and Coherent   Thought content:   Appropriate to Mood and Circumstances   Preoccupation:   Ruminations   Hallucinations:   None   Organization:  No data recorded  [employer] of Knowledge:   Poor   Intelligence:   Average   Abstraction:   Normal   Judgement:   Fair   Reality Testing:   Adequate   Insight:   Fair; Gaps   Decision Making:   Impulsive   Social Functioning  Social Maturity:   Responsible; Isolates   Social Judgement:   Normal   Stress  Stressors:   -- (Unable to gain control of her anxiety  symptoms. Therefore, unable to participate in her community activities as she did in the past.)   Coping Ability:   Overwhelmed   Skill Deficits:   Decision making; Self-care; Self-control   Supports:   Friends/Service system; Family     Religion: Religion/Spirituality Are You A Religious Person?: No  Leisure/Recreation: Leisure / Recreation Do You Have Hobbies?:  (very involved in the community (shelters, orphans, etc.))  Exercise/Diet: Exercise/Diet Have You Gained or Lost A Significant Amount of Weight in the Past Six Months?: Yes-Gained Number of Pounds Gained: 50 (50 in 6 months due to gall stones) Do You Follow a Special Diet?: No Do You Have Any Trouble Sleeping?: Yes Explanation of Sleeping Difficulties: Sleeps in 1-2 intervals   CCA Employment/Education Employment/Work Situation: Employment / Work Situation Employment Situation: Retired Passenger transport manager has Been Impacted by Current Illness: No Has Patient ever Been in Equities trader?: No  Education: Education Is Patient Currently Attending School?: No Last Grade Completed:  Careers adviser) Did Theme park manager?: No Did You Have An Individualized Education Program (IIEP): No Did You Have Any Difficulty At [employer]?: No Patient's Education Has Been Impacted by Current Illness: No   CCA Family/Childhood History Family and Relationship History: Family history Marital status: Single Does patient have children?: No  Childhood History:  Childhood History By whom was/is the patient raised?: Mother Did patient suffer any verbal/emotional/physical/sexual abuse as a child?: No Did patient suffer from severe childhood neglect?: No Has patient ever been sexually abused/assaulted/raped as an adolescent or adult?: No Was the patient ever a victim of a crime or a disaster?: No Witnessed domestic violence?: No Has patient been affected by domestic violence as an adult?: No  Child/Adolescent Assessment:     CCA  Substance Use Alcohol/Drug Use: Alcohol / Drug Use Pain Medications: SEE MAR Prescriptions: SEE MAR Over the Counter: SEE MAR History of alcohol / drug use?: No history of alcohol / drug abuse                         ASAM's:  Six Dimensions of Multidimensional Assessment  Dimension 1:  Acute Intoxication and/or Withdrawal Potential:      Dimension 2:  Biomedical Conditions and Complications:      Dimension 3:  Emotional, Behavioral, or Cognitive Conditions  and Complications:     Dimension 4:  Readiness to Change:     Dimension 5:  Relapse, Continued use, or Continued Problem Potential:     Dimension 6:  Recovery/Living Environment:     ASAM Severity Score:    ASAM Recommended Level of Treatment:     Substance use Disorder (SUD)    Recommendations for Services/Supports/Treatments: Recommendations for Services/Supports/Treatments Recommendations For Services/Supports/Treatments: Individual Therapy, Medication Management, IOP (Intensive Outpatient Program), Partial Hospitalization  Discharge Disposition:    DSM5 Diagnoses: There are no problems to display for this patient.    Referrals to Alternative Service(s): Referred to Alternative Service(s):   Place:   Date:   Time:    Referred to Alternative Service(s):   Place:   Date:   Time:    Referred to Alternative Service(s):   Place:   Date:   Time:    Referred to Alternative Service(s):   Place:   Date:   Time:     Melynda Ripple, Counselor

## 2020-12-17 NOTE — ED Notes (Signed)
Pt was told she was about to get dc. Pt became tearful and stated she was afraid to go home because she might hurt herself. Provider made aware.

## 2020-12-17 NOTE — Progress Notes (Signed)
CSW provided the following resources for the patient to utilize upon discharge listed below:    [facility]-will provide timely access to mental health services for children and adolescents (4-17) and adults presenting in a mental health crisis. The program is designed for those who need urgent Behavioral Health or Substance Use treatment and are not experiencing a medical crisis that would typically require an emergency room visit.    [address] [place], [place] [address] Phone: [phone] [email or web address]   The [facility] will also offer the following outpatient services: (Monday through Friday 8am-5pm)   Partial Hospitalization Program (PHP) Substance Abuse Intensive Outpatient Program (SA-IOP) Group Therapy Medication Management Peer Living Room   We also provide (24/7):    Assessments: Our mental health clinician and providers will conduct a focused mental health evaluation, assessing for immediate safety concerns and further mental health needs.   Referral: Our team will provide resources and help connect to community based mental health treatment, when indicated, including psychotherapy, psychiatry, and other specialized behavioral health or substance use disorder services (for those not already in treatment).   Transitional Care: Our team providers in person bridging and/or telphonic follow-up during the patient's transition to outpatient services.      Jaylan Duggar, MSW, LCSW-A, LCAS-A Phone: [phone] Disposition/TOC 

## 2020-12-17 NOTE — ED Notes (Signed)
2 pt belonging bags in cabinet labeled [id number]

## 2020-12-17 NOTE — Discharge Instructions (Addendum)
For your behavioral health needs you are advised to continue treatment with your regular outpatient provider.  If you feel that you need additional support at this time, consider the Partial Hospitalization Program (PHP) offered by the [facility] at [facility].  This program meets Monday - Friday from 9:00 am - 1:00 pm.  Due to Covid-19 this program is currently virtual.  For details, contact Donia Guiles, LCSW at the phone number indicated below:       [facility] at [facility]. [employer]. [address] [place] [address]      Contact person: Donia Guiles, LCSW      [phone]

## 2020-12-17 NOTE — ED Provider Notes (Signed)
[facility]-EMERGENCY DEPT Provider Note   CSN: [id number] Arrival date & time: 12/17/20  1046     History Chief Complaint  Patient presents with  . Psychiatric Evaluation    Joy Thomas is a 70 y.o. female.  HPI     70 year old female comes in with chief complaint of anxiety.  Patient has history of generalized anxiety disorder, asthma, hypertension.  She is currently on Effexor and BuSpar, Effexor was started just 2 weeks ago.  Patient reports that over the last 4 weeks or so she has been having vivid dreams.  The dreams are waking her up in the middle of the night and then she is unable to sleep.  She is getting more more anxious as of late, and deteriorating.  She has talked to her therapist (not psychiatry) and also gone to ER in [place].  She did not receive any help and was advised to come to [facility].  No SI, HI, but patient states that lately she has not been getting enough rest, has been worried a lot and anxious a lot and almost has had some thoughts of not wanting to live like this.  She does not have any family support locally, but that is not new.  Of note, patient indicates that she had COVID last month.  She is unsure if these vivid dreams started after her diagnosis.  She has generalized weakness and fatigue post-COVID.  Past Medical History:  Diagnosis Date  . Anxiety   . Apnea    C pap  . Cataract    Bilateral - MD just watching  . Chronic interstitial cystitis   . Colitis   . Concussion   . Depression   . Fibromyalgia   . GERD (gastroesophageal reflux disease)   . Headache   . Heart murmur     " slight mumur years ago; doctors don't hear it now "  . High cholesterol   . Hypertension   . Neuropathy    patient denies this dx.  . Pneumonia   . Rheumatoid arthritis (HCC)   . Sleep apnea    wears CPAP  . Vertigo     There are no problems to display for this patient.   Past Surgical History:  Procedure Laterality Date  .  CARPAL TUNNEL RELEASE    . COLONOSCOPY    . RADIOLOGY WITH ANESTHESIA N/A 05/28/2016   Procedure: MRI OF THE BRAIN WITH AND WITHOUT and MRI OF CERVICAL SPINE;  Surgeon: Medication Radiologist, MD;  Location: [facility] OR;  Service: Radiology;  Laterality: N/A;  . RADIOLOGY WITH ANESTHESIA N/A 01/21/2019   Procedure: MRI OF BRAIN WITH OUT CONTRAST WITH ANESTHESIA;  Surgeon: Radiologist, Medication, MD;  Location: [facility] OR;  Service: Radiology;  Laterality: N/A;  . ROTATOR CUFF REPAIR    . TONSILLECTOMY       OB History   No obstetric history on file.     Family History  Problem Relation Age of Onset  . Other Mother        Blunt force trauma  . Stroke Mother   . Heart attack Mother   . Heart failure Father   . Parkinson's disease Father   . Alzheimer's disease Father     Social History   Tobacco Use  . Smoking status: Every Day    Packs/day: 0.75    Years: 51.00    Pack years: 38.25    Types: Cigarettes  . Smokeless tobacco: Never  Vaping Use  . Vaping  Use: Never used  Substance Use Topics  . Alcohol use: No  . Drug use: No    Home Medications Prior to Admission medications   Medication Sig Start Date End Date Taking? Authorizing Provider  acetaminophen (TYLENOL) 500 MG tablet Take 1,000 mg by mouth every 8 (eight) hours as needed for moderate pain or headache.    [provider]  aspirin (ECOTRIN LOW STRENGTH) 81 MG EC tablet Take 81 mg by mouth daily. Swallow whole.    [provider]  olmesartan-hydrochlorothiazide (BENICAR HCT) 20-12.5 MG tablet Take 0.5 tablets by mouth daily.  07/10/05   [provider]  omeprazole (PRILOSEC) 20 MG capsule Take 20 mg by mouth daily.    [provider]  oxybutynin (DITROPAN) 5 MG tablet Take 5 mg by mouth daily.     [provider]  potassium chloride SA (K-DUR,KLOR-CON) 20 MEQ tablet Take 40 mEq by mouth daily.  07/10/05   [provider]    Allergies    Clindamycin, Acetaminophen,  Elmiron [pentosan polysulfate sodium], Hydrocodone-acetaminophen, Ibuprofen, Naproxen, Nsaids, Augmentin [amoxicillin-pot clavulanate], Erythromycin, and Pseudoephedrine  Review of Systems   Review of Systems  Constitutional:  Positive for activity change and fatigue.  Respiratory:  Negative for shortness of breath.   Cardiovascular:  Negative for chest pain.  Gastrointestinal:  Negative for nausea and vomiting.  Psychiatric/Behavioral:  Positive for sleep disturbance. Negative for suicidal ideas. The patient is nervous/anxious.   All other systems reviewed and are negative.  Physical Exam Updated Vital Signs BP (!) 150/80 (BP Location: Left Arm)   Pulse 85   Temp 98.8 F (37.1 C) (Oral)   Resp 16   Ht 5\' 8"  (1.727 m)   Wt 74.8 kg   LMP  (LMP Unknown)   SpO2 100%   BMI 25.09 kg/m   Physical Exam Vitals and nursing note reviewed.  Constitutional:      Appearance: She is well-developed.  HENT:     Head: Atraumatic.  Cardiovascular:     Rate and Rhythm: Normal rate.  Pulmonary:     Effort: Pulmonary effort is normal.  Musculoskeletal:     Cervical back: Normal range of motion and neck supple.  Skin:    General: Skin is warm and dry.  Neurological:     Mental Status: She is alert and oriented to person, place, and time.  Psychiatric:        Mood and Affect: Mood normal.        Behavior: Behavior normal.        Thought Content: Thought content normal.        Judgment: Judgment normal.    ED Results / Procedures / Treatments   Labs (all labs ordered are listed, but only abnormal results are displayed) Labs Reviewed  COMPREHENSIVE METABOLIC PANEL - Abnormal; Notable for the following components:      Result Value   Sodium 132 (*)    Glucose, Bld 129 (*)    All other components within normal limits  RESP PANEL BY RT-PCR (FLU A&B, COVID) ARPGX2  ETHANOL  CBC WITH DIFFERENTIAL/PLATELET  RAPID URINE DRUG SCREEN, HOSP PERFORMED    EKG EKG  Interpretation  Date/Time:  Sunday December 17 2020 12:08:47 EDT Ventricular Rate:  83 PR Interval:  139 QRS Duration: 103 QT Interval:  391 QTC Calculation: 460 R Axis:   16 Text Interpretation: Sinus rhythm Atrial premature complexes No acute changes No significant change since last tracing Confirmed by [id number] [phone]) on 12/17/2020  12:39:42 PM  Radiology No results found.  Procedures Procedures   Medications Ordered in ED Medications  zolpidem (AMBIEN) tablet 5 mg (has no administration in time range)  ondansetron (ZOFRAN) tablet 4 mg (has no administration in time range)  acetaminophen (TYLENOL) tablet 1,000 mg (has no administration in time range)  aspirin EC tablet 81 mg (has no administration in time range)  pantoprazole (PROTONIX) EC tablet 40 mg (has no administration in time range)  potassium chloride SA (KLOR-CON) CR tablet 40 mEq (has no administration in time range)  olmesartan-hydrochlorothiazide (BENICAR HCT) 20-12.5 MG per tablet 0.5 tablet (has no administration in time range)  atorvastatin (LIPITOR) tablet 20 mg (has no administration in time range)  simethicone (MYLICON) 40 mg/0.53ml suspension 40 mg (has no administration in time range)  busPIRone (BUSPAR) tablet 15 mg (has no administration in time range)  venlafaxine (EFFEXOR) tablet 75 mg (has no administration in time range)    ED Course  I have reviewed the triage vital signs and the nursing notes.  Pertinent labs & imaging results that were available during my care of the patient were reviewed by me and considered in my medical decision making (see chart for details).    MDM Rules/Calculators/A&P                           71 year old female with history of anxiety disorder comes in with chief complaint of worsening anxiety.  She was started on Effexor sore 2 weeks back.  Over the last 4 weeks she has been having vivid dreams and unable to sleep well.  Her worries and anxiety has also gotten  worse.  No definite plans of hurting herself, but she does feel like she is in need for some help and is also wanting to be admitted if needed.  One of the confounders is that she had COVID-19 last month.  COVID-19 has known to cause neuropsych disturbance in some patients, so that could be possibly contributing.  Patient has been medically cleared for psych evaluation.  Final Clinical Impression(s) / ED Diagnoses Final diagnoses:  Generalized anxiety disorder    Rx / DC Orders ED Discharge Orders     None        Derwood Kaplan, MD 12/17/20 1248

## 2020-12-17 NOTE — BH Assessment (Signed)
TTS Disposition Note:   TTS completed. Per Maxie Barb, NP, patient is psych cleared. Patient recommended to follow up with her current NP for continued med management. Also, Partial Hospitalization and/or med management. Referral information has been added to patient's chart/AVS by the Disposition Child psychotherapist.

## 2020-12-18 DIAGNOSIS — F411 Generalized anxiety disorder: Secondary | ICD-10-CM

## 2020-12-18 NOTE — Progress Notes (Signed)
CSW spoke with pt to verify address, in chart. CSW to contact transportation for pt upon d/c.   Valentina Shaggy.Charmon Thorson, MSW, LCSWA [facility]  Transitions of Care Clinical Social Worker I Direct Dial: [phone]  Fax: [phone] [email or web address]

## 2020-12-18 NOTE — BH Assessment (Signed)
[facility] Assessment Progress Note   Per Fransisca Kaufmann, NP, this voluntary pt does not require psychiatric hospitalization at this time.  Pt is psychiatrically cleared.  Discharge instructions advise pt to continue treatment with her current outpatient provider, also offering information about the Partial Hospitalization Program offered by the [facility].  EDP Melene Plan, DO and pt's nurse, Florentina Addison, have been notified.  Doylene Canning, MA Triage Specialist [phone]

## 2020-12-18 NOTE — Consult Note (Signed)
Telepsych Consultation   Reason for Consult:  psych consult Referring Physician:  EDP Location of Patient:  Location of Provider: [facility] TTS Department  Patient Identification: Joy Thomas MRN:  [id number] Principal Diagnosis: <principal problem not specified> Diagnosis:  Active Problems:   * No active hospital problems. *   Total Time spent with patient: 30 minutes  Subjective:   Joy Thomas is a 70 y.o. female patient admitted with reports of depression and panic attacks.  HPI:    From initial [facility] Assessment 12/17/20:  Joy Thomas is a 70yo female presenting to the Emergency Department for continued panic attacks. Pt reported initially that she was here for "individual and group therapy" from [place], [place]. States that the Emergency Department doesn't provide those services in [place], [place]. Per patient, "They just assess me, keep me in a room, don't give me medications, send me to a hospital for inpatient treatment".  Therefore, patient paid a transportation company to transport her from [place], [place] to [place], [place], for the cost of $220.    Patient reports being admitted to many hospitals in the past including [facility] in [place], [place], [facility] in [place], [place], and [facility] located in [place], [place]. She indicates that it's many more but that's all she can remember at this moment. Last hospitalization reported was 1 month ago, "a place in [place] but I can't remember the name of the place".  According to patient none of her inpatient hospitalizations have been helpful. She describes them as "a waste of time".  Patient speaks of a significant hx of anxiety starting in the 1980's. She was able to cope with her anxiety for many years, until the last year. Today, she says that her anxiety is unbearable and she fearful of the attacks. Her anxiety became severe in the past year. She is unable to identify any triggers for her depression. Panic attacks are also daily. Last  panic attack was this morning as she was waking up. Additionally, woke up feeling altered, confused, "I didn't know what day it was nor did I know the season". Denies this has ever happened in the past. However, the situation scared her and prompted her ED visit today.    Patient with suicidal ideations. No plan. No intent. No hx of suicide attempts and/or gestures. Denies hx of self-mutilating behaviors. Denies a family hx of mental health illnesses. Denies access to means such as firearms. Denies a hx of trauma and/or abuse. Appetite is poor but due to health issues (gall bladder). She sleeps in 1-2 intervals. States that her anxiety wakes her up. She reports losing 60 pounds in 6 months. Patient denies homicidal ideations.  No plan and/or intent. No legal issues. Denies AVH's. Denies alcohol and/or drug use.    Patient reports the following depressive symptoms: hopelessness, isolating self from others, guilt, irritability, and loss of interest in usual pleasures. States that she was heavily involved in a charity and community projects. However, due to her reported issues with panic attacks she is no longer able to do the things that she enjoys.    Patient lives in [place], [place], alone with her pet cats. She is single and retired. 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 She has a lot of support from "girl friends" and "my younger brother. Raised by her mother.    She sees Adolphus Birchwood, NP, for medication management and has no therapist. She was last seen by a therapist in the 1980's. However, has not discussed with her therapist any  of her symptoms as noted above.   Per assessment 12/18/20:  Patient anxious about being discharged. She was cleared for discharge yesterday [facility] but patient reportedly did not feel safe leaving. She continues to report needing more group therapy and "socialization." However, notes indicate that patient has had many inpatient admissions. She also  currently has an outpatient Provider that has started effexor per her report. She continues to request inpatient care. However, does not endorse active suicidal ideation. When asked what she would do at home if feeling unsafe stated "Call my friends or a crisis line." Patient also reported that she could move up the date of her outpatient appointment. Farhana was recently hospitalized twice during the last six weeks. Her symptoms did not appear to benefit from the treatments provided. She became very distressed when told that outpatient care was still the recommendation based on reported symptoms. Notes also indicate that patient has been spending a great deal of money to travel to various hospitals in order to receive inpatient care. The pattern of behavior has been noted to be the same including bringing up suicidal comments at time of discharge. Patient stated "Please let me stay here. There are people around and I feel so safe." Patient lives alone, appearing to be possibly malingering in order to obtain a more social environment in the hospital.      Past Psychiatric History: Depression, panic attacks  Risk to Self:  Passive no plan reported Risk to Others:  No Prior Inpatient Therapy:  Yes Prior Outpatient Therapy:  Yes  Past Medical History:  Past Medical History:  Diagnosis Date   Anxiety    Apnea    C pap   Cataract    Bilateral - MD just watching   Chronic interstitial cystitis    Colitis    Concussion    Depression    Fibromyalgia    GERD (gastroesophageal reflux disease)    Headache    Heart murmur     " slight mumur years ago; doctors don't hear it now "   High cholesterol    Hypertension    Neuropathy    patient denies this dx.   Pneumonia    Rheumatoid arthritis (HCC)    Sleep apnea    wears CPAP   Vertigo     Past Surgical History:  Procedure Laterality Date   CARPAL TUNNEL RELEASE     COLONOSCOPY     RADIOLOGY WITH ANESTHESIA N/A 05/28/2016   Procedure: MRI  OF THE BRAIN WITH AND WITHOUT and MRI OF CERVICAL SPINE;  Surgeon: Medication Radiologist, MD;  Location: [facility] OR;  Service: Radiology;  Laterality: N/A;   RADIOLOGY WITH ANESTHESIA N/A 01/21/2019   Procedure: MRI OF BRAIN WITH OUT CONTRAST WITH ANESTHESIA;  Surgeon: Radiologist, Medication, MD;  Location: [facility] OR;  Service: Radiology;  Laterality: N/A;   ROTATOR CUFF REPAIR     TONSILLECTOMY     Family History:  Family History  Problem Relation Age of Onset   Other Mother        Blunt force trauma   Stroke Mother    Heart attack Mother    Heart failure Father    Parkinson's disease Father    Alzheimer's disease Father    Family Psychiatric  History: None reported Social History:  Social History   Substance and Sexual Activity  Alcohol Use No     Social History   Substance and Sexual Activity  Drug Use No    Social History  Socioeconomic History   Marital status: Single    Spouse name: Not on file   Number of children: 0   Years of education: Master's   Highest education level: Not on file  Occupational History   Occupation: Retired  Tobacco Use   Smoking status: Every Day    Packs/day: 0.75    Years: 51.00    Pack years: 38.25    Types: Cigarettes   Smokeless tobacco: Never  Vaping Use   Vaping Use: Never used  Substance and Sexual Activity   Alcohol use: No   Drug use: No   Sexual activity: Not on file  Other Topics Concern   Not on file  Social History Narrative   Lives at home w/ her 2 cats   Right-handed   Caffeine: 12-24 oz per day   Social Determinants of Health   Financial Resource Strain: Not on file  Food Insecurity: Not on file  Transportation Needs: Not on file  Physical Activity: Not on file  Stress: Not on file  Social Connections: Not on file   Additional Social History:    Allergies:   Allergies  Allergen Reactions   Clindamycin Other (See Comments)    "COLON INFECTION" > ? C.DIFF ?    Acetaminophen Other (See Comments)     UNSPECIFIED REACTION  > > SEE HYDROCODONE - APAP REACTION < <   Elmiron [Pentosan Polysulfate Sodium] Other (See Comments)    Cramping and burning   Hydrocodone-Acetaminophen Swelling and Rash    SWELLING REACTION UNSPECIFIED  AGITATION   Ibuprofen Other (See Comments)    COLITIS   Naproxen Other (See Comments)    COLITIS   Nsaids Other (See Comments)    COLITIS   Celexa [Citalopram] Other (See Comments)    Confusion, panic attacks   Fluoxetine Other (See Comments)    Panic attacks   Remeron [Mirtazapine] Other (See Comments)    Panic attacks   Augmentin [Amoxicillin-Pot Clavulanate] Nausea And Vomiting     Has patient had a PCN reaction causing immediate rash, facial/tongue/throat swelling, SOB or lightheadedness with hypotension: No Has patient had a PCN reaction causing severe rash involving mucus membranes or skin necrosis: No Has patient had a PCN reaction that required hospitalization No Has patient had a PCN reaction occurring within the last 10 years: No If all of the above answers are "NO", then may proceed with Cephalosporin use.     Erythromycin Nausea And Vomiting    GI upset   Pseudoephedrine Anxiety    Makes her crazy    Labs:  Results for orders placed or performed during the hospital encounter of 12/17/20 (from the past 48 hour(s))  Resp Panel by RT-PCR (Flu A&B, Covid) Nasopharyngeal Swab     Status: None   Collection Time: 12/17/20 11:42 AM   Specimen: Nasopharyngeal Swab; Nasopharyngeal(NP) swabs in vial transport medium  Result Value Ref Range   SARS Coronavirus 2 by RT PCR NEGATIVE NEGATIVE    Comment: (NOTE) SARS-CoV-2 target nucleic acids are NOT DETECTED.  The SARS-CoV-2 RNA is generally detectable in upper respiratory specimens during the acute phase of infection. The lowest concentration of SARS-CoV-2 viral copies this assay can detect is 138 copies/mL. A negative result does not preclude SARS-Cov-2 infection and should not be used as the sole  basis for treatment or other patient management decisions. A negative result may occur with  improper specimen collection/handling, submission of specimen other than nasopharyngeal swab, presence of viral mutation(s) within the areas targeted  by this assay, and inadequate number of viral copies(<138 copies/mL). A negative result must be combined with clinical observations, patient history, and epidemiological information. The expected result is Negative.  Fact Sheet for Patients:  BloggerCourse.com  Fact Sheet for Healthcare Providers:  SeriousBroker.it  This test is no t yet approved or cleared by the [place] FDA and  has been authorized for detection and/or diagnosis of SARS-CoV-2 by FDA under an Emergency Use Authorization (EUA). This EUA will remain  in effect (meaning this test can be used) for the duration of the COVID-19 declaration under Section 564(b)(1) of the Act, 21 U.S.C.section 360bbb-3(b)(1), unless the authorization is terminated  or revoked sooner.       Influenza A by PCR NEGATIVE NEGATIVE   Influenza B by PCR NEGATIVE NEGATIVE    Comment: (NOTE) The Xpert Xpress SARS-CoV-2/FLU/RSV plus assay is intended as an aid in the diagnosis of influenza from Nasopharyngeal swab specimens and should not be used as a sole basis for treatment. Nasal washings and aspirates are unacceptable for Xpert Xpress SARS-CoV-2/FLU/RSV testing.  Fact Sheet for Patients: BloggerCourse.com  Fact Sheet for Healthcare Providers: SeriousBroker.it  This test is not yet approved or cleared by the [place] FDA and has been authorized for detection and/or diagnosis of SARS-CoV-2 by FDA under an Emergency Use Authorization (EUA). This EUA will remain in effect (meaning this test can be used) for the duration of the COVID-19 declaration under Section 564(b)(1) of the Act, 21  U.S.C. section 360bbb-3(b)(1), unless the authorization is terminated or revoked.  Performed at [facility], [address] [place] [place], [place] [address]   Comprehensive metabolic panel     Status: Abnormal   Collection Time: 12/17/20 11:42 AM  Result Value Ref Range   Sodium 132 (L) 135 - 145 mmol/L   Potassium 4.0 3.5 - 5.1 mmol/L   Chloride 99 98 - 111 mmol/L   CO2 25 22 - 32 mmol/L   Glucose, Bld 129 (H) 70 - 99 mg/dL    Comment: Glucose reference range applies only to samples taken after fasting for at least 8 hours.   BUN 16 8 - 23 mg/dL   Creatinine, Ser 6.04 0.44 - 1.00 mg/dL   Calcium 9.3 8.9 - 54.0 mg/dL   Total Protein 7.6 6.5 - 8.1 g/dL   Albumin 4.3 3.5 - 5.0 g/dL   AST 25 15 - 41 U/L   ALT 22 0 - 44 U/L   Alkaline Phosphatase 71 38 - 126 U/L   Total Bilirubin 1.2 0.3 - 1.2 mg/dL   GFR, Estimated >98 >11 mL/min    Comment: (NOTE) Calculated using the CKD-EPI Creatinine Equation (2021)    Anion gap 8 5 - 15    Comment: Performed at [facility], [address], [place], [place] [address]  Ethanol     Status: None   Collection Time: 12/17/20 11:42 AM  Result Value Ref Range   Alcohol, Ethyl (B) <10 <10 mg/dL    Comment: (NOTE) Lowest detectable limit for serum alcohol is 10 mg/dL.  For medical purposes only. Performed at [facility], [address] [place], [place] [address]   Urine rapid drug screen (hosp performed)     Status: None   Collection Time: 12/17/20 11:42 AM  Result Value Ref Range   Opiates NONE DETECTED NONE DETECTED   Cocaine NONE DETECTED NONE DETECTED   Benzodiazepines NONE DETECTED NONE DETECTED   Amphetamines NONE DETECTED NONE DETECTED  Tetrahydrocannabinol NONE DETECTED NONE DETECTED   Barbiturates NONE DETECTED NONE DETECTED    Comment: (NOTE) DRUG SCREEN FOR MEDICAL PURPOSES ONLY.  IF CONFIRMATION IS NEEDED FOR ANY PURPOSE, NOTIFY LAB WITHIN 5 DAYS.  LOWEST DETECTABLE  LIMITS FOR URINE DRUG SCREEN Drug Class                     Cutoff (ng/mL) Amphetamine and metabolites    1000 Barbiturate and metabolites    200 Benzodiazepine                 200 Tricyclics and metabolites     300 Opiates and metabolites        300 Cocaine and metabolites        300 THC                            50 Performed at [facility], [address] [place], [place] [address]   CBC with Diff     Status: None   Collection Time: 12/17/20 11:42 AM  Result Value Ref Range   WBC 8.2 4.0 - 10.5 K/uL   RBC 4.20 3.87 - 5.11 MIL/uL   Hemoglobin 12.0 12.0 - 15.0 g/dL   HCT 94.5 03.8 - 88.2 %   MCV 90.5 80.0 - 100.0 fL   MCH 28.6 26.0 - 34.0 pg   MCHC 31.6 30.0 - 36.0 g/dL   RDW 80.0 34.9 - 17.9 %   Platelets 367 150 - 400 K/uL   nRBC 0.0 0.0 - 0.2 %   Neutrophils Relative % 73 %   Neutro Abs 5.9 1.7 - 7.7 K/uL   Lymphocytes Relative 21 %   Lymphs Abs 1.7 0.7 - 4.0 K/uL   Monocytes Relative 5 %   Monocytes Absolute 0.4 0.1 - 1.0 K/uL   Eosinophils Relative 1 %   Eosinophils Absolute 0.1 0.0 - 0.5 K/uL   Basophils Relative 0 %   Basophils Absolute 0.0 0.0 - 0.1 K/uL   Immature Granulocytes 0 %   Abs Immature Granulocytes 0.02 0.00 - 0.07 K/uL    Comment: Performed at [facility], [address], [place], [place] [address]    Medications:  Current Facility-Administered Medications  Medication Dose Route Frequency Provider Last Rate Last Admin   acetaminophen (TYLENOL) tablet 1,000 mg  1,000 mg Oral Q8H PRN Derwood Kaplan, MD       atorvastatin (LIPITOR) tablet 20 mg  20 mg Oral Daily Rhunette Croft, Ankit, MD   20 mg at 12/18/20 0930   busPIRone (BUSPAR) tablet 15 mg  15 mg Oral TID Derwood Kaplan, MD   15 mg at 12/18/20 0930   desipramine (NORPRAMIN) tablet 25 mg  25 mg Oral Daily Nanavati, Ankit, MD       dicyclomine (BENTYL) capsule 10 mg  10 mg Oral QID PRN Rhunette Croft, Ankit, MD       docusate sodium (COLACE) capsule 100 mg  100 mg  Oral Daily PRN Rhunette Croft, Ankit, MD       famotidine (PEPCID) tablet 20 mg  20 mg Oral BID Nanavati, Ankit, MD   20 mg at 12/18/20 0930   irbesartan (AVAPRO) tablet 150 mg  150 mg Oral Daily Rhunette Croft, Ankit, MD   150 mg at 12/18/20 9794   And   hydrochlorothiazide (MICROZIDE) capsule 12.5 mg  12.5 mg Oral Daily Rhunette Croft, Ankit, MD   12.5 mg at 12/18/20 0929   ondansetron (  ZOFRAN-ODT) disintegrating tablet 8 mg  8 mg Oral Q8H PRN Rhunette Croft, Ankit, MD       pantoprazole (PROTONIX) EC tablet 40 mg  40 mg Oral Daily Nanavati, Ankit, MD   40 mg at 12/18/20 0930   polyethylene glycol (MIRALAX / GLYCOLAX) packet 17 g  17 g Oral Daily PRN Derwood Kaplan, MD       simethicone (MYLICON) 40 mg/0.9ml suspension 40 mg  40 mg Oral Once Derwood Kaplan, MD       simethicone (MYLICON) chewable tablet 80-160 mg  80-160 mg Oral Q6H PRN Rhunette Croft, Ankit, MD       venlafaxine XR (EFFEXOR-XR) 24 hr capsule 37.5 mg  37.5 mg Oral QHS Nanavati, Ankit, MD       vitamin B-12 (CYANOCOBALAMIN) tablet 1,000 mcg  1,000 mcg Oral Daily Rhunette Croft, Ankit, MD   1,000 mcg at 12/18/20 0929   zolpidem (AMBIEN) tablet 5 mg  5 mg Oral QHS PRN Derwood Kaplan, MD       Current Outpatient Medications  Medication Sig Dispense Refill   acetaminophen (TYLENOL) 500 MG tablet Take 1,000 mg by mouth every 8 (eight) hours as needed for moderate pain or headache.     atorvastatin (LIPITOR) 20 MG tablet Take 20 mg by mouth daily.     busPIRone (BUSPAR) 15 MG tablet Take 15 mg by mouth 3 (three) times daily.     desipramine (NORPRAMIN) 25 MG tablet Take 25 mg by mouth daily.     dicyclomine (BENTYL) 10 MG capsule Take 10 mg by mouth 4 (four) times daily as needed for cramping.     docusate sodium (COLACE) 100 MG capsule Take 100 mg by mouth daily as needed for mild constipation.     famotidine (PEPCID) 20 MG tablet Take 20 mg by mouth 2 (two) times daily.     Multiple Vitamins-Minerals (CENTRUM SILVER 50+WOMEN) TABS Take 1 tablet by mouth daily.      olmesartan-hydrochlorothiazide (BENICAR HCT) 20-12.5 MG tablet Take 1 tablet by mouth daily.     omeprazole (PRILOSEC) 20 MG capsule Take 20 mg by mouth daily.     ondansetron (ZOFRAN-ODT) 8 MG disintegrating tablet Take 8 mg by mouth every 8 (eight) hours as needed for nausea.     polyethylene glycol (MIRALAX / GLYCOLAX) 17 g packet Take 17 g by mouth daily as needed for mild constipation.     potassium chloride SA (K-DUR,KLOR-CON) 20 MEQ tablet Take 40 mEq by mouth daily.      simethicone (MYLICON) 80 MG chewable tablet Chew 80-160 mg by mouth every 6 (six) hours as needed for flatulence.     venlafaxine XR (EFFEXOR-XR) 37.5 MG 24 hr capsule Take 37.5 mg by mouth at bedtime.     vitamin B-12 (CYANOCOBALAMIN) 1000 MCG tablet Take 1,000 mcg by mouth daily.     aspirin 81 MG EC tablet Take 81 mg by mouth daily. Swallow whole.      Musculoskeletal: Unable to assess via camera          Psychiatric Specialty Exam:  Presentation  General Appearance: Appropriate for Environment; Well Groomed  Eye Contact:Good  Speech:Clear and Coherent; Normal Rate  Speech Volume:Normal  Handedness: No data recorded  Mood and Affect  Mood:Anxious  Affect:Congruent   Thought Process  Thought Processes:Coherent; Goal Directed; Linear  Descriptions of Associations:Intact  Orientation:Full (Time, Place and Person)  Thought Content:WDL  History of Schizophrenia/Schizoaffective disorder:No  Duration of Psychotic Symptoms:No data recorded Hallucinations:No data recorded Ideas of Reference:None  Suicidal Thoughts:No data recorded Homicidal Thoughts:No data recorded  Sensorium  Memory:Immediate Good; Recent Good; Remote Good  Judgment:Good  Insight:Fair   Executive Functions  Concentration:Good  Attention Span:Good  Recall:Good  Fund of Knowledge:Good  Language:Good   Psychomotor Activity  Psychomotor Activity: No data recorded  Assets  Assets:Communication  Skills; Desire for Improvement; Financial Resources/Insurance; Housing; Resilience; Social Support; Transportation   Sleep  Sleep: No data recorded   Physical Exam: Physical Exam Review of Systems  Psychiatric/Behavioral:  Positive for depression. The patient is nervous/anxious.   Blood pressure 129/79, pulse (!) 104, temperature 98.8 F (37.1 C), temperature source Oral, resp. rate 16, height 5\' 8"  (1.727 m), weight 74.8 kg, SpO2 98 %. Body mass index is 25.09 kg/m.  Treatment Plan Summary: Discharge to home with outpatient care follow up. Reports that she sees , NP and has upcoming appointment.  Disposition: No evidence of imminent risk to self or others at present.   Patient does not meet criteria for psychiatric inpatient admission. Supportive therapy provided about ongoing stressors. Discussed crisis plan, support from social network, calling 911, coming to the Emergency Department, and calling Suicide Hotline.  This service was provided via telemedicine using a 2-way, interactive audio and video technology.  Names of all persons participating in this telemedicine service and their role in this encounter. Name: Adolphus Birchwood Role: Provider  Name: Fransisca Kaufmann Role: Patient  Name:  Role:   Name:  Role:     Zella Ball, NP 12/18/2020 1:15 PM

## 2021-03-10 IMAGING — MR MR HEAD W/O CM
16 of 17 series · 44 of 48 positions shown · non-contrast
Comparison: Brain MRI 05/28/2016

CLINICAL DATA: Dizziness.  Unsteady gait.

EXAM:
MRI HEAD WITHOUT CONTRAST
TECHNIQUE: Multiplanar, multiecho pulse sequences of the brain and surrounding
structures were obtained without intravenous contrast.

[Series 5: DWI · axial · 3.0mm · 0.88mm/px · z∈[-95,+49]mm · 6 of 98 slices shown (1 of 4)]
[im 1/98]
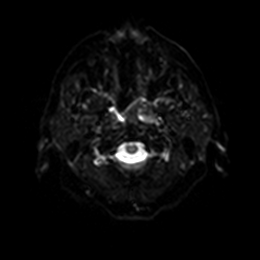
[im 20/98]
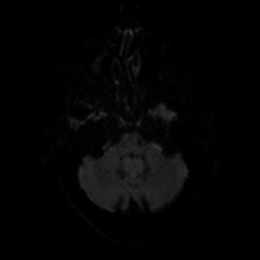
[im 39/98]
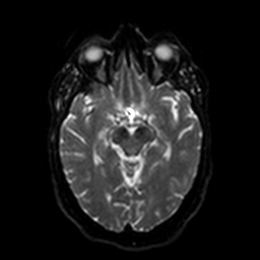
[im 59/98]
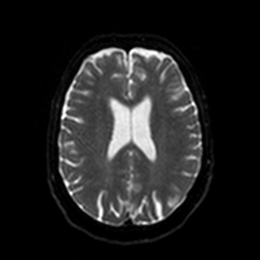
[im 78/98]
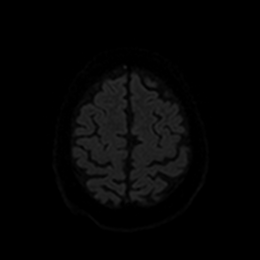
[im 98/98]
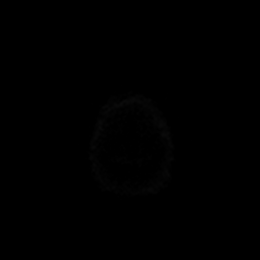

[Series 6: DWI · axial · 3.0mm · 0.88mm/px · z∈[-95,+49]mm · 2 of 49 slices shown (2 of 4)]
[im 1/49]
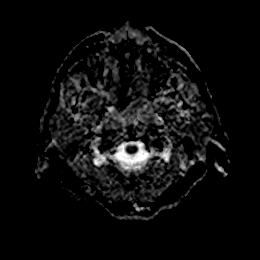
[im 49/49]
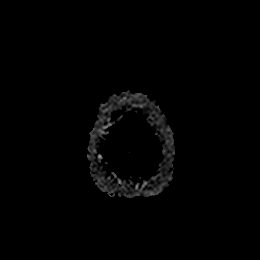

[Series 7: DWI · coronal · 4.0mm · 0.88mm/px · 4 of 64 slices shown (3 of 4)]
[im 1/64]
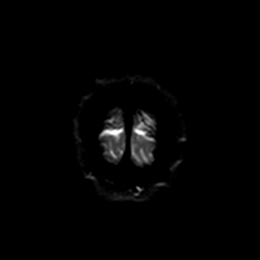
[im 22/64]
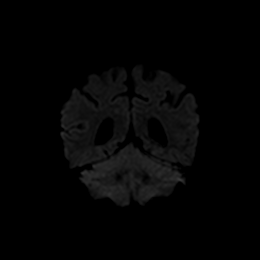
[im 43/64]
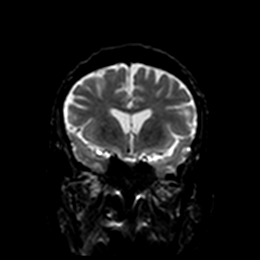
[im 64/64]
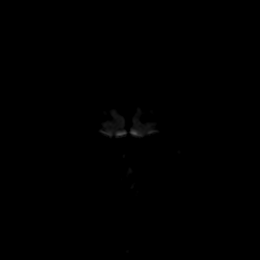

[Series 8: DWI · coronal · 4.0mm · 0.88mm/px · 2 of 32 slices shown (4 of 4)]
[im 1/32]
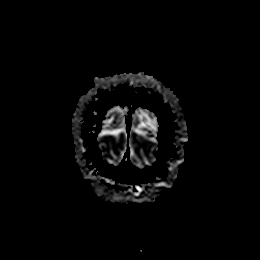
[im 32/32]
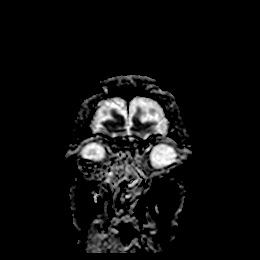

[Series 9: T1 · sagittal · 5.0mm · 0.75mm/px · 2 of 23 slices shown]
[im 1/23]
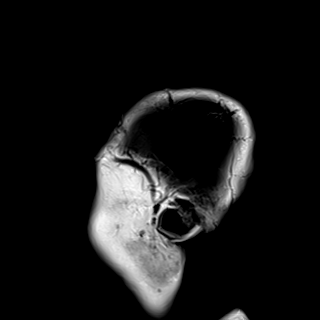
[im 23/23]
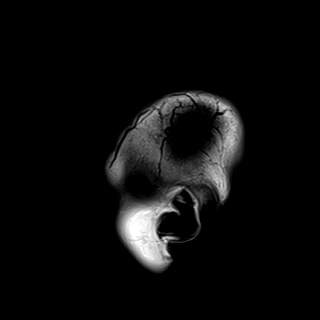

[Series 10: T2 · axial · 5.0mm · 0.72mm/px · z∈[-91,+52]mm · 2 of 25 slices shown (1 of 2)]
[im 1/25]
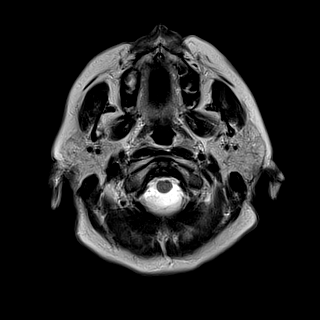
[im 25/25]
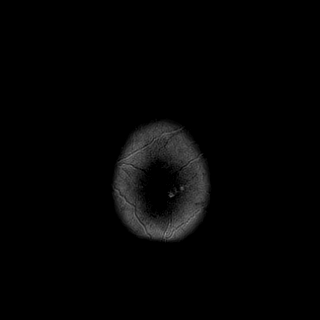

[Series 16: FLAIR · axial · 5.0mm · 0.90mm/px · z∈[-110,+39]mm · 2 of 26 slices shown (1 of 2)]
[im 1/26]
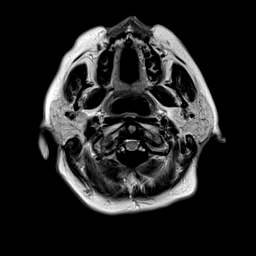
[im 26/26]
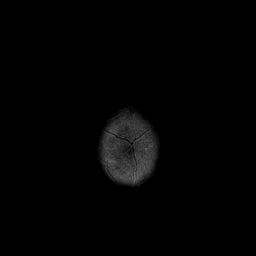

[Series 22: FLAIR · axial · 5.0mm · 0.90mm/px · z∈[-114,+29]mm · 2 of 25 slices shown (2 of 2)]
[im 1/25]
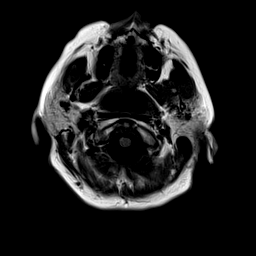
[im 25/25]
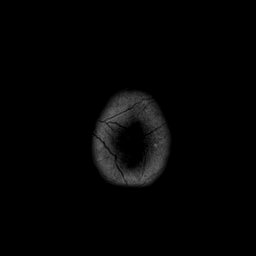

[Series 23: T2 · coronal · 5.0mm · 0.72mm/px · 2 of 28 slices shown (2 of 2)]
[im 1/28]
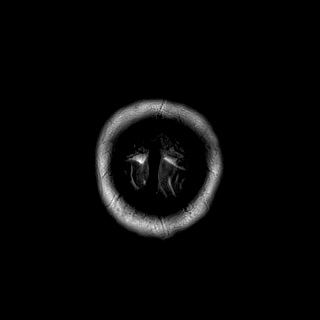
[im 28/28]
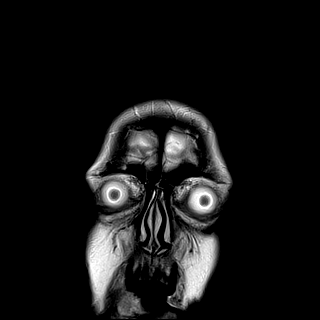

[Series 24: ax hemo · axial · 5.0mm · 0.86mm/px · z∈[-107,+36]mm · 2 of 25 slices shown]
[im 1/25]
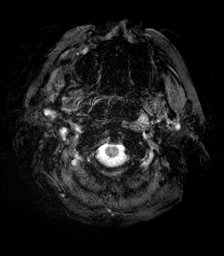
[im 25/25]
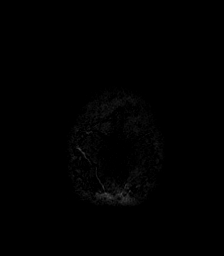

[Series 30: mag_images · axial · 3.0mm · 0.90mm/px · z∈[-97,+55]mm · 4 of 52 slices shown]
[im 1/52]
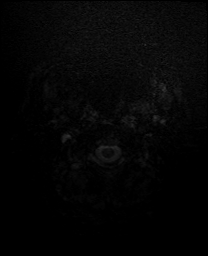
[im 18/52]
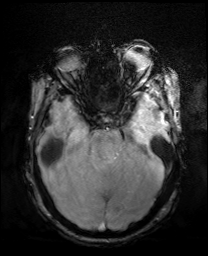
[im 35/52]
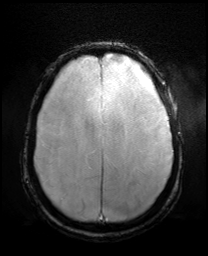
[im 52/52]
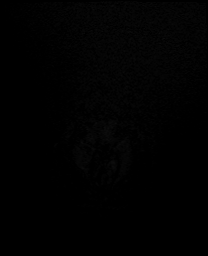

[Series 31: pha_images · axial · 3.0mm · 0.90mm/px · z∈[-97,+55]mm · 4 of 52 slices shown]
[im 1/52]
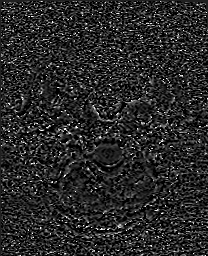
[im 18/52]
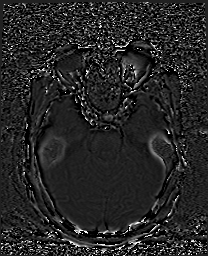
[im 35/52]
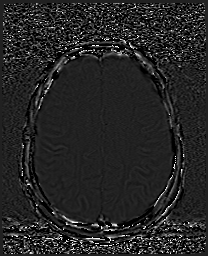
[im 52/52]
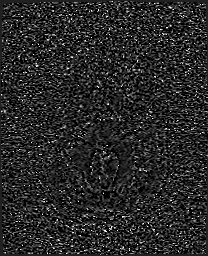

[Series 32: swi_images · axial · 3.0mm · 0.90mm/px · z∈[-97,+55]mm · 4 of 52 slices shown]
[im 1/52]
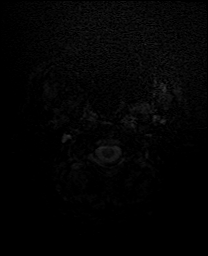
[im 18/52]
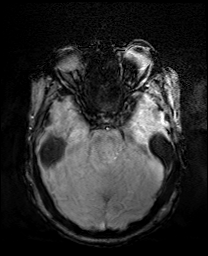
[im 35/52]
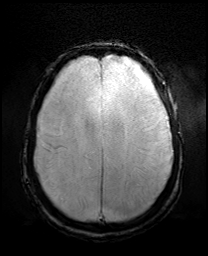
[im 52/52]
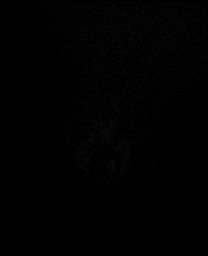

[Series 35: t1_tse_tra_3mm · axial · 3.0mm · 0.31mm/px · 1 of 15 slices shown]
[im 1/15]
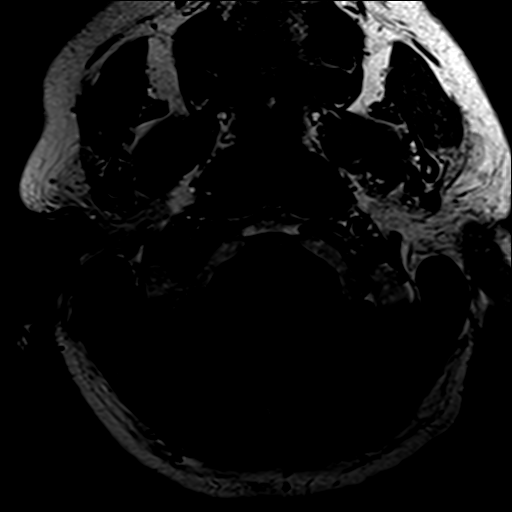

[Series 36: t2_(id)_tra_iso · axial · 0.6mm · 0.56mm/px · z∈[-91,-53]mm · 4 of 64 slices shown]
[im 1/64]
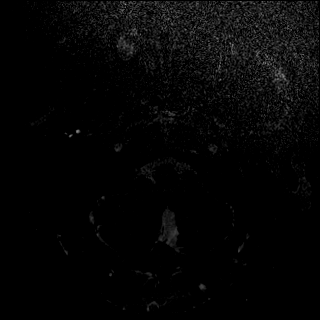
[im 22/64]
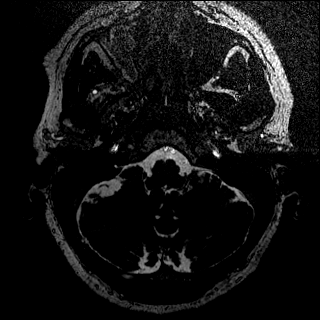
[im 43/64]
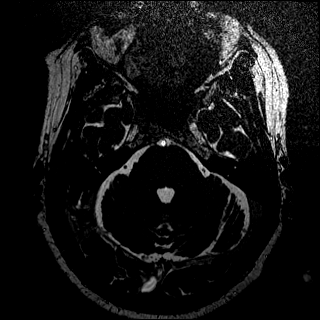
[im 64/64]
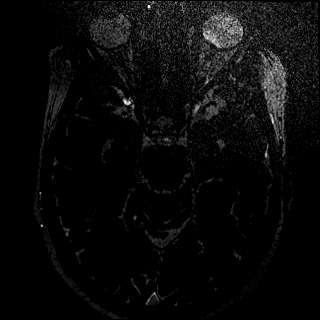

[Series 37: t1_tse_r_cor_3mm · coronal · 3.0mm · 0.33mm/px · 1 of 13 slices shown]
[im 1/13]
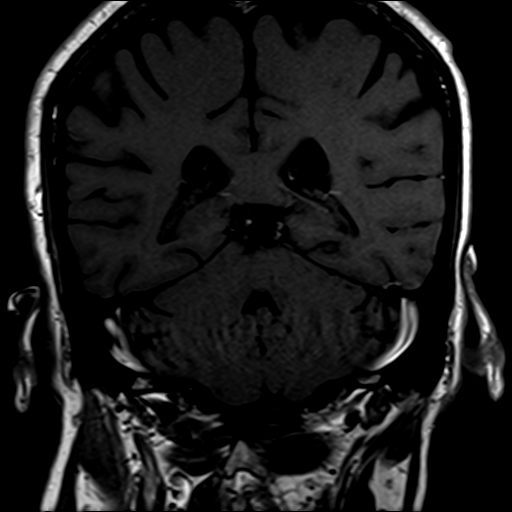

[44 of 48 positions shown; findings below may reference images not displayed]

FINDINGS: Brain:

Despite general anesthesia, multiple sequences are mildly motion
degraded.

There is no evidence of acute infarct.

No evidence of intracranial mass.

No midline shift or extra-axial fluid collection.

No chronic intracranial blood products.

No focal parenchymal signal abnormality is identified.

No cerebellopontine angle mass or internal auditory canal lesion is
demonstrated on this noncontrast MRI. Unremarkable appearance of the
seventh and eighth cranial nerves bilaterally.

Cerebral volume is normal for age.

Vascular: Flow voids maintained within the proximal large arterial
vessels.

Skull and upper cervical spine: No focal marrow lesion.

Sinuses/Orbits: Visualized orbits demonstrate no acute abnormality.
Complete opacification of an atelectatic right maxillary sinus with
proteinaceous material and/or fluid. Mild ethmoid sinus mucosal
thickening. No significant mastoid effusion.
IMPRESSION: 1. Normal MRI appearance of the brain for age. No evidence of acute
intracranial abnormality.
2. An atelectatic right maxillary sinus is completely opacified with
proteinaceous material and/or fluid.

## 2021-09-24 ENCOUNTER — Other Ambulatory Visit (HOSPITAL_COMMUNITY): Payer: Self-pay | Admitting: Family Medicine

## 2021-09-24 ENCOUNTER — Other Ambulatory Visit: Payer: Self-pay | Admitting: Family Medicine

## 2021-09-24 DIAGNOSIS — F17200 Nicotine dependence, unspecified, uncomplicated: Secondary | ICD-10-CM

## 2023-12-03 DEATH — deceased
